# Patient Record
Sex: Female | Born: 1945 | Race: White | Hispanic: No | Marital: Married | Smoking: Never smoker
Health system: Southern US, Community
[De-identification: ages and names within clinical notes are randomized; demographics above are authoritative.]

## PROBLEM LIST (undated history)

## (undated) DIAGNOSIS — R51 Headache: Secondary | ICD-10-CM

## (undated) DIAGNOSIS — U071 COVID-19: Secondary | ICD-10-CM

## (undated) DIAGNOSIS — R519 Headache, unspecified: Secondary | ICD-10-CM

## (undated) DIAGNOSIS — K469 Unspecified abdominal hernia without obstruction or gangrene: Secondary | ICD-10-CM

## (undated) DIAGNOSIS — K219 Gastro-esophageal reflux disease without esophagitis: Secondary | ICD-10-CM

## (undated) DIAGNOSIS — I10 Essential (primary) hypertension: Secondary | ICD-10-CM

## (undated) DIAGNOSIS — G8929 Other chronic pain: Secondary | ICD-10-CM

## (undated) HISTORY — DX: Essential (primary) hypertension: I10

## (undated) HISTORY — DX: Headache: R51

## (undated) HISTORY — DX: Other chronic pain: G89.29

## (undated) HISTORY — DX: Unspecified abdominal hernia without obstruction or gangrene: K46.9

## (undated) HISTORY — PX: OTHER SURGICAL HISTORY: SHX169

## (undated) HISTORY — PX: TUBAL LIGATION: SHX77

## (undated) HISTORY — PX: CHOLECYSTECTOMY: SHX55

## (undated) HISTORY — PX: TONSILLECTOMY: SUR1361

## (undated) HISTORY — PX: APPENDECTOMY: SHX54

## (undated) HISTORY — PX: COLONOSCOPY: SHX174

## (undated) HISTORY — DX: Headache, unspecified: R51.9

---

## 1998-08-01 ENCOUNTER — Ambulatory Visit: Admission: RE | Admit: 1998-08-01 | Discharge: 1998-08-01 | Payer: Self-pay

## 1998-10-28 ENCOUNTER — Encounter: Payer: Self-pay | Admitting: Pulmonary Disease

## 1998-10-28 ENCOUNTER — Ambulatory Visit (HOSPITAL_COMMUNITY): Admission: RE | Admit: 1998-10-28 | Discharge: 1998-10-28 | Payer: Self-pay | Admitting: Pulmonary Disease

## 2006-11-16 ENCOUNTER — Encounter: Admission: RE | Admit: 2006-11-16 | Discharge: 2006-11-16 | Payer: Self-pay | Admitting: Orthopedic Surgery

## 2006-12-08 ENCOUNTER — Encounter: Admission: RE | Admit: 2006-12-08 | Discharge: 2007-01-12 | Payer: Self-pay | Admitting: Orthopedic Surgery

## 2007-08-12 ENCOUNTER — Other Ambulatory Visit: Admission: RE | Admit: 2007-08-12 | Discharge: 2007-08-12 | Payer: Self-pay | Admitting: Family Medicine

## 2007-08-12 ENCOUNTER — Ambulatory Visit: Payer: Self-pay | Admitting: Family Medicine

## 2007-08-12 DIAGNOSIS — R079 Chest pain, unspecified: Secondary | ICD-10-CM | POA: Insufficient documentation

## 2007-08-15 ENCOUNTER — Encounter: Payer: Self-pay | Admitting: Family Medicine

## 2007-08-15 DIAGNOSIS — E785 Hyperlipidemia, unspecified: Secondary | ICD-10-CM | POA: Insufficient documentation

## 2007-08-17 LAB — CONVERTED CEMR LAB: Pap Smear: NORMAL

## 2007-08-19 ENCOUNTER — Telehealth (INDEPENDENT_AMBULATORY_CARE_PROVIDER_SITE_OTHER): Payer: Self-pay | Admitting: *Deleted

## 2010-02-16 LAB — CONVERTED CEMR LAB
ALT: 23 units/L (ref 0–35)
AST: 22 units/L (ref 0–37)
Albumin: 3.8 g/dL (ref 3.5–5.2)
Alkaline Phosphatase: 102 units/L (ref 39–117)
BUN: 14 mg/dL (ref 6–23)
CO2: 23 meq/L (ref 19–32)
Calcium: 9 mg/dL (ref 8.4–10.5)
Chloride: 108 meq/L (ref 96–112)
Cholesterol, target level: 200 mg/dL
Cholesterol: 212 mg/dL — ABNORMAL HIGH (ref 0–200)
Creatinine, Ser: 0.78 mg/dL (ref 0.40–1.20)
Glucose, Bld: 97 mg/dL (ref 70–99)
HDL goal, serum: 40 mg/dL
HDL: 53 mg/dL (ref >39)
LDL Cholesterol: 134 mg/dL — ABNORMAL HIGH (ref 0–99)
LDL Goal: 160 mg/dL
Potassium: 4 meq/L (ref 3.5–5.3)
Sodium: 141 meq/L (ref 135–145)
TSH: 2.084 microintl units/mL (ref 0.350–4.50)
Total Bilirubin: 0.2 mg/dL — ABNORMAL LOW (ref 0.3–1.2)
Total CHOL/HDL Ratio: 4
Total Protein: 6.6 g/dL (ref 6.0–8.3)
Triglycerides: 124 mg/dL (ref <150)
VLDL: 25 mg/dL (ref 0–40)

## 2011-04-24 ENCOUNTER — Encounter: Payer: Self-pay | Admitting: Pulmonary Disease

## 2011-04-27 ENCOUNTER — Ambulatory Visit (INDEPENDENT_AMBULATORY_CARE_PROVIDER_SITE_OTHER): Payer: Medicare Other | Admitting: Critical Care Medicine

## 2011-04-27 ENCOUNTER — Telehealth: Payer: Self-pay | Admitting: Critical Care Medicine

## 2011-04-27 ENCOUNTER — Encounter: Payer: Self-pay | Admitting: Critical Care Medicine

## 2011-04-27 VITALS — BP 156/80 | HR 78 | Temp 98.0°F | Ht 68.0 in | Wt 197.0 lb

## 2011-04-27 DIAGNOSIS — R05 Cough: Secondary | ICD-10-CM

## 2011-04-27 DIAGNOSIS — I10 Essential (primary) hypertension: Secondary | ICD-10-CM

## 2011-04-27 DIAGNOSIS — J679 Hypersensitivity pneumonitis due to unspecified organic dust: Secondary | ICD-10-CM | POA: Insufficient documentation

## 2011-04-27 DIAGNOSIS — R918 Other nonspecific abnormal finding of lung field: Secondary | ICD-10-CM

## 2011-04-27 DIAGNOSIS — R059 Cough, unspecified: Secondary | ICD-10-CM

## 2011-04-27 LAB — IGE: IgE (Immunoglobulin E), Serum: 12.8 IU/mL (ref 0.0–180.0)

## 2011-04-27 MED ORDER — PREDNISONE 10 MG PO TABS: ORAL_TABLET | ORAL | Status: DC

## 2011-04-27 MED ORDER — FLUTICASONE PROPIONATE 50 MCG/ACT NA SUSP: 2.0000 | Freq: Every day | NASAL | Status: DC

## 2011-04-27 MED ORDER — RABEPRAZOLE SODIUM 20 MG PO TBEC: 20.0000 mg | DELAYED_RELEASE_TABLET | Freq: Every day | ORAL | Status: DC

## 2011-04-27 MED ORDER — BUDESONIDE-FORMOTEROL FUMARATE 160-4.5 MCG/ACT IN AERO: 2.0000 | INHALATION_SPRAY | Freq: Two times a day (BID) | RESPIRATORY_TRACT | Status: DC

## 2011-04-27 NOTE — Patient Instructions (Signed)
Prednisone 10mg  Take 4 for four days 3 for four days 2 for four days 1 for four days Symbicort two puff twice daily Flonase two puff each nostril daily Use aciphex daily Labs today I will review CT and call you Return 6 weeks for recheck Follow reflux diet Keep sugar free lozenge in mouth

## 2011-04-27 NOTE — Progress Notes (Signed)
Subjective:    Patient ID: Claudia Joseph, female    DOB: 08-Oct-1945, 66 y.o.   MRN: [id number]  HPI Comments: Pt been to ENT and had CT Scan sinus and was clear  [place] Swallow eval was normal Has EGD and colonoscopy planned per [facility]  Dr Jason Fila Pt dyspneic with exertion  Cough This is a recurrent problem. The current episode started more than 1 month ago. The problem has been unchanged. Episode frequency: worse at night.  also if gets hot in the car will cough. The cough is productive of sputum (mouth is dry, mucus now is white to pale yellow). Associated symptoms include headaches, postnasal drip, a sore throat, shortness of breath and wheezing. Pertinent negatives include no chest pain, chills, ear congestion, ear pain, fever, heartburn, myalgias, rash or rhinorrhea. Associated symptoms comments: Wants to clear throat and is hoarse. The symptoms are aggravated by lying down and fumes (strangled on food, if eats chicken salad will burp this up). Risk factors for lung disease include occupational exposure (Works at a [employer], Several employees ill, black mold under the building.  HVAC replaced and cleaned. Also has a basement finsished and is in the basement). Treatments tried: No recent pred pulse. Her past medical history is significant for environmental allergies. There is no history of asthma, bronchiectasis, bronchitis, COPD, emphysema or pneumonia. allergy tests positive for mold.   66 y.o. WF referred for abn CT Chest Pt with bronchitis yearly.  At least twice a year. Past year ill 11/12.  Rx ABX per PCP.  Not much improvement,  Pt rx pred x 30.   Cough was primary issue.  Loss of voice for two weeks.   Then ok in Jan/Feb.  This ill again,  Few weeks ago. More cough productive returned. Rx avelox for 10day (has two left)  Cough seems the same.    Past Medical History  Diagnosis Date  . Head pain, chronic     history of  . Hypertension   . Hernia      Family History    Problem Relation Age of Onset  . Leukemia Father     deceased  . Colon cancer Maternal Grandmother     deceased at age 18  . Tongue cancer Brother      History   Social History  . Marital Status: Married    Spouse Name: N/A    Number of Children: 3  . Years of Education: N/A   Occupational History  . Dental Office    Social History Main Topics  . Smoking status: Never Smoker   . Smokeless tobacco: Never Used  . Alcohol Use: No  . Drug Use: No  . Sexually Active: Not on file   Other Topics Concern  . Not on file   Social History Narrative  . No narrative on file     Allergies  Allergen Reactions  . Meperidine Hcl     Demerol causes severe HAs     No outpatient prescriptions prior to visit.      Review of Systems  Constitutional: Positive for fatigue. Negative for fever, chills, diaphoresis, activity change, appetite change and unexpected weight change.  HENT: Positive for congestion, sore throat, trouble swallowing, voice change, postnasal drip and sinus pressure. Negative for hearing loss, ear pain, nosebleeds, facial swelling, rhinorrhea, sneezing, mouth sores, neck pain, neck stiffness, dental problem, tinnitus and ear discharge.   Eyes: Negative for photophobia, discharge, itching and visual disturbance.  Respiratory: Positive for  cough, choking, shortness of breath and wheezing. Negative for apnea, chest tightness and stridor.   Cardiovascular: Negative for chest pain, palpitations and leg swelling.  Gastrointestinal: Positive for nausea and constipation. Negative for heartburn, vomiting, abdominal pain, blood in stool and abdominal distention.  Genitourinary: Negative for dysuria, urgency, frequency, hematuria, flank pain, decreased urine volume and difficulty urinating.  Musculoskeletal: Positive for back pain. Negative for myalgias, joint swelling, arthralgias and gait problem.  Skin: Negative for color change, pallor and rash.  Neurological: Positive  for headaches. Negative for dizziness, tremors, seizures, syncope, speech difficulty, weakness, light-headedness and numbness.  Hematological: Positive for environmental allergies. Negative for adenopathy. Does not bruise/bleed easily.  Psychiatric/Behavioral: Positive for sleep disturbance. Negative for confusion and agitation. The patient is not nervous/anxious.        Objective:   Physical Exam Filed Vitals:   04/27/11 1011  BP: 156/80  Pulse: 78  Temp: 98 F (36.7 C)  TempSrc: Oral  Height: 5\' 8"  (1.727 m)  Weight: 197 lb (89.359 kg)  SpO2: 96%    Gen: Pleasant, well-nourished, in no distress,  normal affect  ENT: No lesions,  mouth clear,  oropharynx clear, +++ postnasal drip, mild nasal inflammation  Neck: No JVD, no TMG, no carotid bruits  Lungs: No use of accessory muscles, no dullness to percussion,exp wheezes  Cardiovascular: RRR, heart sounds normal, no murmur or gallops, no peripheral edema  Abdomen: soft and NT, no HSM,  BS normal  Musculoskeletal: No deformities, no cyanosis or clubbing  Neuro: alert, non focal  Skin: Warm, no lesions or rashes   04/22/11  CT Chest :  Non specific LUL opacities, LLL nodule  4/ 8/13 Spiro: normal spirometry        Assessment & Plan:   Cough 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 amLODipine (NORVASC) 5 MG tablet Take 1 tablet by mouth Daily.      . AVELOX 400 MG tablet Take 1 tablet by mouth  daily.      . CASCARA SAGRADA PO Take by mouth as needed.      . Glucosamine-Chondroitin (MOVE FREE PO) Take 2 tablets by mouth daily.      . hydrochlorothiazide (HYDRODIURIL) 25 MG tablet Take 1 tablet by mouth daily.      . mupirocin ointment (BACTROBAN) 2 % as needed.      . RABEprazole (ACIPHEX) 20 MG tablet Take 1 tablet (20 mg total) by mouth daily.      Marland Kitchen triamcinolone (KENALOG) 0.1 % paste as needed.      Marland Kitchen DISCONTD: RABEprazole (ACIPHEX) 20 MG tablet Take 20 mg by mouth daily as needed.      . budesonide-formoterol (SYMBICORT) 160-4.5 MCG/ACT inhaler Inhale 2 puffs into the lungs 2 (two) times daily.  1 Inhaler  12  . fluticasone (FLONASE) 50 MCG/ACT nasal spray Place 2 sprays into the nose daily.  16 g  6  . predniSONE (DELTASONE) 10 MG tablet Take 4 for four  days 3 for four days 2 for four days 1 for four days  40 tablet  0

## 2011-04-27 NOTE — Telephone Encounter (Signed)
Pt wants to know would Dr. Delford Field rec that her family be tested for mold exposure as well? Please advise. Carron Curie, CMA

## 2011-04-27 NOTE — Telephone Encounter (Signed)
Not as of yet

## 2011-04-27 NOTE — Telephone Encounter (Signed)
Pt aware of PWs recs.

## 2011-04-27 NOTE — Assessment & Plan Note (Signed)
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

## 2011-04-28 ENCOUNTER — Telehealth: Payer: Self-pay | Admitting: Critical Care Medicine

## 2011-04-28 NOTE — Telephone Encounter (Signed)
Dr. Jason Fila office is asking for clearance for the pt to have colonoscopy. I advised Dr. Delford Field is out of the office and they said this would be ok to wait till his return. Please advise. Carron Curie, CMA

## 2011-04-30 ENCOUNTER — Institutional Professional Consult (permissible substitution): Payer: Self-pay | Admitting: Critical Care Medicine

## 2011-04-30 ENCOUNTER — Encounter: Payer: Self-pay | Admitting: *Deleted

## 2011-04-30 LAB — FUNGAL ANTIBODIES PANEL, ID-BLOOD
Aspergillus fumigatus: NEGATIVE
Coccidioides Antibody ID: NEGATIVE
Histoplasma Antibody, ID: NEGATIVE

## 2011-04-30 NOTE — Telephone Encounter (Signed)
Spoke with Diane and notified ok per PW for colonoscopy. Letter faxed to her attn per her request at [phone].

## 2011-04-30 NOTE — Telephone Encounter (Signed)
The pt may have a colonoscopy

## 2011-04-30 NOTE — Telephone Encounter (Signed)
lmomtcb x1 for diane 

## 2011-04-30 NOTE — Telephone Encounter (Signed)
LMTCB for [name] Is VO okay or does she need letter or form filled out? Will await call back.

## 2011-05-04 LAB — HYPERSENSITIVITY PNUEMONITIS PROFILE

## 2011-05-07 ENCOUNTER — Telehealth: Payer: Self-pay | Admitting: *Deleted

## 2011-05-07 NOTE — Telephone Encounter (Signed)
No She needs a repeat OV to regroup

## 2011-05-07 NOTE — Telephone Encounter (Signed)
PT is having Endoscopy 05-08-11.  She wants to know if Dr Delford Field thinks she should have Chest CT repeated since  The first one was not a good picture.  Please advise.

## 2011-05-07 NOTE — Telephone Encounter (Signed)
Pt is aware and will plan to keep follow-up on 06/04/11 in [place] with PW.

## 2011-06-04 ENCOUNTER — Ambulatory Visit (INDEPENDENT_AMBULATORY_CARE_PROVIDER_SITE_OTHER): Payer: Medicare Other | Admitting: Critical Care Medicine

## 2011-06-04 ENCOUNTER — Encounter: Payer: Self-pay | Admitting: Critical Care Medicine

## 2011-06-04 ENCOUNTER — Ambulatory Visit (HOSPITAL_BASED_OUTPATIENT_CLINIC_OR_DEPARTMENT_OTHER)
Admission: RE | Admit: 2011-06-04 | Discharge: 2011-06-04 | Disposition: A | Discharge status: Home / Self Care | Payer: Medicare Other | Source: Ambulatory Visit | Attending: Critical Care Medicine | Admitting: Critical Care Medicine

## 2011-06-04 VITALS — BP 136/82 | HR 87 | Temp 98.5°F | Ht 68.0 in | Wt 197.0 lb

## 2011-06-04 DIAGNOSIS — R05 Cough: Secondary | ICD-10-CM | POA: Insufficient documentation

## 2011-06-04 DIAGNOSIS — R062 Wheezing: Secondary | ICD-10-CM | POA: Insufficient documentation

## 2011-06-04 DIAGNOSIS — R0602 Shortness of breath: Secondary | ICD-10-CM | POA: Insufficient documentation

## 2011-06-04 DIAGNOSIS — J8410 Pulmonary fibrosis, unspecified: Secondary | ICD-10-CM

## 2011-06-04 DIAGNOSIS — R059 Cough, unspecified: Secondary | ICD-10-CM

## 2011-06-04 NOTE — Assessment & Plan Note (Addendum)
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 Extensive exposure in the home, proven Plan Stay on flonase and symbcort Stay on PPI Avoid mold exposure

## 2011-06-04 NOTE — Patient Instructions (Addendum)
Stay on flonase and symbicort Avoid mold exposure Stay on aciphex Reflux diet Return 4 months

## 2011-06-04 NOTE — Progress Notes (Signed)
Quick Note:  Called, spoke with pt. I informed her of CXR results and recs per Dr. Wright. She verbalized understanding of this and voiced no further questions/concerns at this time. ______ 

## 2011-06-04 NOTE — Progress Notes (Signed)
Quick Note:  Notify the patient that the Xray is stable and no pneumonia No change in medications are recommended. Continue current meds as prescribed at last office visit ______ 

## 2011-06-04 NOTE — Progress Notes (Signed)
Subjective:    Patient ID: Claudia Joseph, female    DOB: 1945/11/25, 66 y.o.   MRN: [id number]  HPI 66 y.o. WF referred for abn CT Chest Pt with bronchitis yearly.  At least twice a year. Past year ill 11/12.  Rx ABX per PCP.  Not much improvement,  Pt rx pred x 30.   Cough was primary issue.  Loss of voice for two weeks.   Then ok in Jan/Feb.  This ill again,  Few weeks ago. More cough productive returned. Rx avelox for 10day (has two left)  Cough seems the same.    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 No chest pain.  Dyspnea is better.  No fever.  Off ABX Took the pred and is on symbicort.  Pt had EGD : hiatal hernia and stretched esophagus.     Past Medical History  Diagnosis Date  . Head pain, chronic     history of  . Hypertension   . Hernia      Family History  Problem Relation Age of Onset  . Leukemia Father     deceased  . Colon cancer Maternal Grandmother     deceased at age 47  . Tongue cancer Brother      History   Social History  . Marital Status: Married    Spouse Name: N/A    Number of Children: 3  . Years of Education: N/A   Occupational History  . Dental Office    Social History Main Topics  . Smoking status: Never Smoker   . Smokeless tobacco: Never Used  . Alcohol Use: No  . Drug Use: No  . Sexually Active: Not on file   Other Topics Concern  . Not on file   Social History Narrative  . No narrative on file     Allergies  Allergen Reactions  . Meperidine Hcl    Demerol causes severe HAs     Outpatient Prescriptions Prior to Visit  Medication Sig Dispense Refill  . amLODipine (NORVASC) 5 MG tablet Take 1 tablet by mouth Daily.      . budesonide-formoterol (SYMBICORT) 160-4.5 MCG/ACT inhaler Inhale 2 puffs into the lungs 2 (two) times daily.  1 Inhaler  12  . CASCARA SAGRADA PO Take by mouth as needed.      . fluticasone (FLONASE) 50 MCG/ACT nasal spray Place 2 sprays into the nose daily.  16 g  6  . Glucosamine-Chondroitin (MOVE FREE PO) Take 2 tablets by mouth daily.      . hydrochlorothiazide (HYDRODIURIL) 25 MG tablet Take 1 tablet by mouth daily.      . RABEprazole (ACIPHEX) 20 MG tablet Take 1 tablet (20 mg total) by mouth daily.      Marland Kitchen triamcinolone (KENALOG) 0.1 % paste as needed.      . AVELOX 400 MG tablet Take 1 tablet by mouth daily.      . mupirocin ointment (BACTROBAN) 2 % as needed.      . predniSONE (DELTASONE) 10  MG tablet Take 4 for four days 3 for four days 2 for four days 1 for four days  40 tablet  0      Review of Systems  Constitutional: Positive for fatigue. Negative for diaphoresis, activity change, appetite change and unexpected weight change.  HENT: Positive for congestion, trouble swallowing, voice change and sinus pressure. Negative for hearing loss, nosebleeds, facial swelling, sneezing, mouth sores, neck pain, neck stiffness, dental problem, tinnitus and ear discharge.   Eyes: Negative for photophobia, discharge, itching and visual disturbance.  Respiratory: Positive for choking. Negative for apnea, chest tightness and stridor.   Cardiovascular: Negative for palpitations and leg swelling.  Gastrointestinal: Positive for nausea and constipation. Negative for vomiting, abdominal pain, blood in stool and abdominal distention.  Genitourinary: Negative for dysuria, urgency, frequency, hematuria, flank pain, decreased urine volume and difficulty urinating.  Musculoskeletal: Positive for back pain. Negative for joint  swelling, arthralgias and gait problem.  Skin: Negative for color change and pallor.  Neurological: Negative for dizziness, tremors, seizures, syncope, speech difficulty, weakness, light-headedness and numbness.  Hematological: Negative for adenopathy. Does not bruise/bleed easily.  Psychiatric/Behavioral: Positive for sleep disturbance. Negative for confusion and agitation. The patient is not nervous/anxious.        Objective:   Physical Exam  Filed Vitals:   06/04/11 1359  BP: 136/82  Pulse: 87  Temp: 98.5 F (36.9 C)  TempSrc: Oral  Height: 5\' 8"  (1.727 m)  Weight: 197 lb (89.359 kg)  SpO2: 97%    Gen: Pleasant, well-nourished, in no distress,  normal affect  ENT: No lesions,  mouth clear,  oropharynx clear, + postnasal drip, mild nasal inflammation  Neck: No JVD, no TMG, no carotid bruits  Lungs: No use of accessory muscles, no dullness to percussion,no wheezes  Cardiovascular: RRR, heart sounds normal, no murmur or gallops, no peripheral edema  Abdomen: soft and NT, no HSM,  BS normal  Musculoskeletal: No deformities, no cyanosis or clubbing  Neuro: alert, non focal  Skin: Warm, no lesions or rashes   04/22/11  CT Chest :  Non specific LUL opacities, LLL nodule  4/ 8/13 Spiro: normal spirometry        Assessment & Plan:   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 Extensive exposure in the home, proven Plan Stay on flonase and symbcort Stay on PPI Avoid mold exposure     Updated Medication List Outpatient Encounter Prescriptions as of 06/04/2011  Medication Sig Dispense Refill  . amLODipine (NORVASC) 5 MG tablet Take 1 tablet by mouth Daily.      . budesonide-formoterol (SYMBICORT) 160-4.5 MCG/ACT inhaler Inhale 2 puffs into  the lungs 2 (two) times daily.  1 Inhaler  12  . CASCARA SAGRADA PO Take by mouth as needed.      . fluticasone (FLONASE) 50 MCG/ACT nasal spray Place 2 sprays into the nose daily.  16 g  6  . Glucosamine-Chondroitin (MOVE FREE PO) Take 2 tablets by mouth daily.      . hydrochlorothiazide (HYDRODIURIL) 25 MG tablet Take 1 tablet by mouth daily.      . RABEprazole (ACIPHEX) 20 MG tablet Take 1 tablet (20 mg total) by mouth daily.      Marland Kitchen triamcinolone (KENALOG) 0.1 % paste as needed.      Marland Kitchen DISCONTD: AVELOX 400 MG  tablet Take 1 tablet by mouth daily.      Marland Kitchen DISCONTD: mupirocin ointment (BACTROBAN) 2 % as needed.      Marland Kitchen DISCONTD: predniSONE (DELTASONE) 10 MG tablet Take 4 for four days 3 for four days 2 for four days 1 for four days  40 tablet  0

## 2011-09-14 ENCOUNTER — Encounter: Payer: Self-pay | Admitting: Adult Health

## 2011-09-14 ENCOUNTER — Ambulatory Visit (INDEPENDENT_AMBULATORY_CARE_PROVIDER_SITE_OTHER): Payer: Medicare Other | Admitting: Adult Health

## 2011-09-14 VITALS — BP 140/90 | HR 88 | Temp 97.6°F | Ht 68.0 in | Wt 199.8 lb

## 2011-09-14 DIAGNOSIS — J40 Bronchitis, not specified as acute or chronic: Secondary | ICD-10-CM

## 2011-09-14 MED ORDER — AZITHROMYCIN 250 MG PO TABS: ORAL_TABLET | ORAL | Status: AC

## 2011-09-14 MED ORDER — PREDNISONE 10 MG PO TABS: ORAL_TABLET | ORAL | Status: DC

## 2011-09-14 NOTE — Progress Notes (Signed)
Subjective:    Patient ID: Claudia Joseph, female    DOB: [birth year], 66 y.o.   MRN: [id number]  HPI 66 y.o. WF never smoker referred for abn CT Chest Pt with bronchitis yearly.  At least twice a year. Past year ill 11/12.  Rx ABX per PCP.  Not much improvement,  Pt rx pred x 30.   Cough was primary issue.  Loss of voice for two weeks.   Then ok in Jan/Feb.  This ill again,  Few weeks ago. More cough productive returned. Rx avelox for 10day (has two left)  Cough seems the same.    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 No chest pain.  Dyspnea is better.  No fever.  Off ABX Took the pred and is on symbicort.  Pt had EGD : hiatal hernia and stretched esophagus.    09/14/2011 Acute OV  Complains of c/o cough with white mucus, wheezing, sob, and chest tightness for 3 days.  Was doing okay until last few days.  Coughing up copious white/yellow.  Hoarseness and sinus drainage.  Stopped symbicort because she felt so much better . Stopped around 6-8 weeks .  No fever , hemoptysis , edema.  Off zyrtec.   Review of Systems  Constitutional:   No  weight loss, night sweats,  Fevers, chills,  +fatigue, or  lassitude.  HEENT:   No headaches,  Difficulty swallowing,  Tooth/dental problems, or  Sore throat,                No sneezing, itching, ear ache,  +nasal congestion, post nasal drip,   CV:  No chest pain,  Orthopnea, PND, swelling in lower extremities, anasarca, dizziness, palpitations, syncope.   GI  No heartburn,  indigestion, abdominal pain, nausea, vomiting, diarrhea, change in bowel habits, loss of appetite, bloody stools.   Resp:    No coughing up of blood.     No chest wall deformity  Skin: no rash or lesions.  GU: no dysuria, change in color of urine, no urgency or frequency.  No flank pain, no hematuria   MS:  No joint pain or swelling.  No decreased range of motion.  No back pain.  Psych:  No change in mood or affect. No depression or anxiety.  No memory loss.          Objective:   Physical Exam   Gen: Pleasant, well-nourished, in no distress,  normal affect  ENT: No lesions,  mouth clear,  oropharynx clear, + postnasal drip, mild nasal inflammation  Neck: No JVD, no TMG, no carotid bruits  Lungs: No use of accessory muscles, no dullness to percussion,no wheezes  Cardiovascular: RRR, heart sounds normal, no murmur or gallops, no peripheral edema  Abdomen: soft and NT, no HSM,  BS normal  Musculoskeletal: No deformities, no cyanosis or clubbing  Neuro: alert, non focal  Skin: Warm, no lesions or rashes   04/22/11  CT Chest :  Non specific LUL opacities, LLL nodule  4/ 8/13 Cleda Daub:  normal spirometry        Assessment & Plan:

## 2011-09-14 NOTE — Assessment & Plan Note (Signed)
Mild flare with associated AR  Underlying hx of hypersensitivity pnemonitis w/ prev mold exposure  No known exposure of recent exposure   Plan;  rednisone  Taper over next week.  Restart Symbicort two puff twice daily  Restart Flonase two puff each nostril daily Delsym 2 tsp Twice daily  As needed  Cough  May use Zyrtec 10mg  At bedtime  As needed  Drainage  Saline nasal spray As needed   Please contact office for sooner follow up if symptoms do not improve or worsen or seek emergency care  follow up Dr. Delford Field  Next month and As needed   Zpack -to have on hold if symptoms worsen with discolored mucus .

## 2011-09-14 NOTE — Patient Instructions (Addendum)
Prednisone  Taper over next week.  Restart Symbicort two puff twice daily  Restart Flonase two puff each nostril daily Delsym 2 tsp Twice daily  As needed  Cough  May use Zyrtec 10mg  At bedtime  As needed  Drainage  Saline nasal spray As needed   Please contact office for sooner follow up if symptoms do not improve or worsen or seek emergency care  follow up Dr. Delford Field  Next month and As needed   Zpack -to have on hold if symptoms worsen with discolored mucus .

## 2011-10-08 ENCOUNTER — Ambulatory Visit (INDEPENDENT_AMBULATORY_CARE_PROVIDER_SITE_OTHER): Payer: Medicare Other | Admitting: Critical Care Medicine

## 2011-10-08 ENCOUNTER — Ambulatory Visit (HOSPITAL_BASED_OUTPATIENT_CLINIC_OR_DEPARTMENT_OTHER)
Admission: RE | Admit: 2011-10-08 | Discharge: 2011-10-08 | Disposition: A | Discharge status: Home / Self Care | Payer: Medicare Other | Source: Ambulatory Visit | Attending: Critical Care Medicine | Admitting: Critical Care Medicine

## 2011-10-08 ENCOUNTER — Encounter: Payer: Self-pay | Admitting: Critical Care Medicine

## 2011-10-08 VITALS — BP 152/82 | HR 74 | Temp 97.7°F | Ht 68.0 in | Wt 203.0 lb

## 2011-10-08 DIAGNOSIS — J679 Hypersensitivity pneumonitis due to unspecified organic dust: Secondary | ICD-10-CM

## 2011-10-08 DIAGNOSIS — J984 Other disorders of lung: Secondary | ICD-10-CM | POA: Insufficient documentation

## 2011-10-08 NOTE — Progress Notes (Signed)
Subjective:    Patient ID: Claudia Joseph, female    DOB: 1945/09/05, 66 y.o.   MRN: [id number]  HPI HPI 66 y.o. WF never smoker referred for abn CT Chest Pt with bronchitis yearly.  At least twice a year. Past year ill 11/12.  Rx ABX per PCP.  Not much improvement,  Pt rx pred x 30.   Cough was primary issue.  Loss of voice for two weeks.   Then ok in Jan/Feb.  This ill again,  Few weeks ago. More cough productive returned. Rx avelox for 10day (has two left)  Cough seems the same.    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 No chest pain.  Dyspnea is better.  No fever.  Off ABX Took the pred and is on symbicort.  Pt had EGD : hiatal hernia and stretched esophagus.    09/14/2011 Acute OV  Complains of c/o cough with white mucus, wheezing, sob, and chest tightness for 3 days.  Was doing okay until last few days.  Coughing up copious white/yellow.  Hoarseness and sinus drainage.  Stopped symbicort because she felt so much better . Stopped around 6-8 weeks .  No fever , hemoptysis , edema.  Off zyrtec.   10/08/2011 Seen by NP 8/26 and Rx rednisone Taper over next week.  Restart Symbicort two puff twice daily  Restart Flonase two puff each nostril daily  Delsym 2 tsp Twice daily As needed Cough  May use Zyrtec 10mg  At bedtime As needed Drainage  Saline nasal spray As needed  Since saw NP is better.  Saw PCP and had low potassium.  WBC elevated. LFTs elevated. U/S liver done:  Fatty liver.  Rx keflex one week ago per  pcp, leg bite and infected.  Site on leg is better.   Now  : min cough,  Clears throat a lot, mucus is white foamy.  Notes some wheezing. Pt is dyspneic if in a hurry.      Review of Systems 11 pt Ros neg.except as above    Objective:   Physical Exam Filed Vitals:   10/08/11 1417  BP: 152/82  Pulse: 74  Temp: 97.7 F (36.5 C)  TempSrc: Oral  Height: 5\' 8"  (1.727 m)  Weight: 203 lb (92.08 kg)  SpO2: 98%    Gen: Pleasant, well-nourished, in no distress,  normal affect  ENT: No lesions,  mouth clear,  oropharynx clear, no postnasal drip  Neck: No JVD, no TMG, no carotid bruits  Lungs: No use of accessory muscles, no dullness to percussion, clear without rales or rhonchi  Cardiovascular: RRR, heart sounds normal, no murmur or gallops, no peripheral edema  Abdomen: soft and NT, no HSM,  BS normal  Musculoskeletal: No deformities, no cyanosis or clubbing  Neuro: alert, non focal  Skin: Warm, no lesions or rashes  No results found.        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 amLODipine (NORVASC) 5 MG tablet Take 1 tablet by mouth Daily.      . ASTEPRO 0.15 % SOLN Place 2 sprays into the nose as needed.      . budesonide-formoterol (SYMBICORT) 160-4.5 MCG/ACT inhaler Inhale 2 puffs into the lungs as needed.      . CASCARA SAGRADA PO Take by mouth as needed.      . cephALEXin (KEFLEX) 500 MG capsule Take 500 mg by mouth 2 (two)  times daily.      Marland Kitchen dexlansoprazole (DEXILANT) 60 MG capsule Take 60 mg by mouth daily.      . fluticasone (FLONASE) 50 MCG/ACT nasal spray Place 1 spray into the nose daily as needed.       . Glucosamine-Chondroitin (MOVE FREE PO) Take 2 tablets by mouth daily.      . potassium chloride (MICRO-K) 10 MEQ CR capsule Take 1 tablet by mouth Daily.      Marland Kitchen triamcinolone (KENALOG) 0.1 % paste as needed.      . hydrochlorothiazide (HYDRODIURIL) 25 MG tablet Take 1 tablet by mouth daily.      Marland Kitchen DISCONTD: predniSONE (DELTASONE) 10 MG tablet 4 tabs for 2 days, then 3 tabs for 2 days, 2 tabs for 2 days, then 1 tab for 2 days, then stop  20 tablet  0  . DISCONTD: RABEprazole (ACIPHEX) 20 MG tablet Take 1 tablet (20 mg total) by mouth daily.

## 2011-10-08 NOTE — Patient Instructions (Addendum)
Follow reflux diet strict Stay on symbicort If nose bleeds again, hold flonase Stay on astepro CT chest non contrast today if possible, I will call with results Return 4 months

## 2011-10-08 NOTE — Assessment & Plan Note (Signed)
History of hypersensitivity pneumonitis, upper and lower airway inflammation with associated reflux disease and environmental allergic factors History of pulmonary nodules as documented on CT the chest likely allergic pneumonitis in nature Plan Follow reflux diet strict Stay on symbicort If nose bleeds again, hold flonase Stay on astepro CT chest non contrast today to followup lung nodules Return 4 months

## 2012-02-11 ENCOUNTER — Encounter: Payer: Self-pay | Admitting: Critical Care Medicine

## 2012-02-11 ENCOUNTER — Ambulatory Visit (INDEPENDENT_AMBULATORY_CARE_PROVIDER_SITE_OTHER): Payer: Medicare Other | Admitting: Critical Care Medicine

## 2012-02-11 VITALS — BP 134/90 | HR 67 | Temp 98.2°F | Ht 68.0 in | Wt 200.0 lb

## 2012-02-11 DIAGNOSIS — R918 Other nonspecific abnormal finding of lung field: Secondary | ICD-10-CM

## 2012-02-11 DIAGNOSIS — J679 Hypersensitivity pneumonitis due to unspecified organic dust: Secondary | ICD-10-CM

## 2012-02-11 NOTE — Progress Notes (Signed)
Subjective:    Patient ID: Claudia Joseph, female    DOB: [birth year], 67 y.o.   MRN: [id number]  HPI  HPI 67 y.o. WF never smoker referred for abn CT Chest Pt with bronchitis yearly.  At least twice a year. Past year ill 11/12.  Rx ABX per PCP.  Not much improvement,  Pt rx pred x 30.   Cough was primary issue.  Loss of voice for two weeks.   Then ok in Jan/Feb.  This ill again,  Few weeks ago. More cough productive returned. Rx avelox for 10day (has two left)  Cough seems the same.    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 No chest pain.  Dyspnea is better.  No fever.  Off ABX Took the pred and is on symbicort.  Pt had EGD : hiatal hernia and stretched esophagus.    09/14/2011 Acute OV  Complains of c/o cough with white mucus, wheezing, sob, and chest tightness for 3 days.  Was doing okay until last few days.  Coughing up copious white/yellow.  Hoarseness and sinus drainage.  Stopped symbicort because she felt so much better . Stopped around 6-8 weeks .  No fever , hemoptysis , edema.  Off zyrtec.   10/08/2011 Seen by NP 8/26 and Rx rednisone Taper over next week.  Restart Symbicort two puff twice daily  Restart Flonase two puff each nostril daily  Delsym 2 tsp Twice daily As needed Cough  May use Zyrtec 10mg  At bedtime As needed Drainage  Saline nasal spray As needed  Since saw NP is better.  Saw PCP and had low potassium.  WBC elevated. LFTs elevated. U/S liver done:  Fatty liver.  Rx keflex one week ago per  pcp, leg bite and infected.  Site on leg is better.   Now  : min cough,  Clears throat a lot, mucus is white foamy.  Notes some wheezing. Pt is dyspneic if in a hurry.  02/11/2012 Pt had mold in the basement. $30000 to repair. 3/13 was repaired. In air vents.    Pt has weaned off inhalers and ok without.  Now no cough or dyspnea    Review of Systems  11 pt Ros neg.except as above    Objective:   Physical Exam  Filed Vitals:   02/11/12 1523  BP: 134/90  Pulse: 67  Temp: 98.2 F (36.8 C)  TempSrc: Oral  Height: 5\' 8"  (1.727 m)  Weight: 200 lb (90.719 kg)  SpO2: 100%    Gen: Pleasant, well-nourished, in no distress,  normal affect  ENT: No lesions,  mouth clear,  oropharynx clear, no postnasal drip  Neck: No JVD, no TMG, no carotid bruits  Lungs: No use of accessory muscles, no dullness to percussion, clear without rales or rhonchi  Cardiovascular: RRR, heart sounds normal, no murmur or gallops, no peripheral edema  Abdomen: soft and NT, no HSM,  BS normal  Musculoskeletal: No deformities, no cyanosis or clubbing  Neuro: alert, non focal  Skin: Warm, no  lesions or rashes  No results found.        Assessment & Plan:   No problem-specific assessment & plan notes found for this encounter.   Updated Medication List Outpatient Encounter Prescriptions as of 02/11/2012  Medication Sig Dispense Refill  . amLODipine (NORVASC) 5 MG tablet Take 1 tablet by mouth Daily.      . ASTEPRO 0.15 % SOLN Place 2 sprays into the nose as needed.      . budesonide-formoterol (SYMBICORT) 160-4.5 MCG/ACT inhaler Inhale 2 puffs into the lungs as needed.      . CASCARA SAGRADA PO Take by mouth as needed.      Marland Kitchen dexlansoprazole (DEXILANT) 60 MG capsule Take 60 mg by mouth daily.      . fluticasone (FLONASE) 50 MCG/ACT nasal spray Place 1 spray into the nose daily as needed.       . Glucosamine-Chondroitin (MOVE FREE PO) Take 2 tablets by mouth daily.      Marland Kitchen triamcinolone (KENALOG)  0.1 % paste as needed.      . [DISCONTINUED] cephALEXin (KEFLEX) 500 MG capsule Take 500 mg by mouth 2 (two) times daily.      . [DISCONTINUED] hydrochlorothiazide (HYDRODIURIL) 25 MG tablet Take 1 tablet by mouth daily.      . [DISCONTINUED] potassium chloride (MICRO-K) 10 MEQ CR capsule Take 1 tablet by mouth Daily.

## 2012-02-11 NOTE — Assessment & Plan Note (Addendum)
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

## 2012-02-11 NOTE — Patient Instructions (Addendum)
You may stop symbicort  You can stop astepro Use flonase as needed Return 8months with CT Chest ( we will order prior to visit)

## 2012-09-28 ENCOUNTER — Ambulatory Visit: Payer: Medicare Other | Admitting: Critical Care Medicine

## 2012-10-03 ENCOUNTER — Ambulatory Visit: Payer: Self-pay | Admitting: Critical Care Medicine

## 2012-10-03 ENCOUNTER — Ambulatory Visit (HOSPITAL_BASED_OUTPATIENT_CLINIC_OR_DEPARTMENT_OTHER)
Admission: RE | Admit: 2012-10-03 | Discharge: 2012-10-03 | Disposition: A | Discharge status: Home / Self Care | Payer: Medicare Other | Source: Ambulatory Visit | Attending: Critical Care Medicine | Admitting: Critical Care Medicine

## 2012-10-03 DIAGNOSIS — R918 Other nonspecific abnormal finding of lung field: Secondary | ICD-10-CM

## 2012-10-03 DIAGNOSIS — R059 Cough, unspecified: Secondary | ICD-10-CM | POA: Insufficient documentation

## 2012-10-03 DIAGNOSIS — R05 Cough: Secondary | ICD-10-CM | POA: Insufficient documentation

## 2012-10-04 ENCOUNTER — Telehealth: Payer: Self-pay | Admitting: *Deleted

## 2012-10-04 NOTE — Telephone Encounter (Signed)
Can cancel appt if feeling ok

## 2012-10-04 NOTE — Telephone Encounter (Signed)
Called pt to inform her of CT Chest results:  Result Note    Call pt and tell her CT chest stable, no change in nodules.   Would like one more CT in one year, if stable can the n stop any further CTs   -------  Spoke with pt.  Informed her of Ct Chest results and recs per Dr. Delford Field.  She verbalized understanding of this.  Pt has a pending OV with PW on this Thursday in [place].  She would like to know if PW still wants her to keep this OV.  Dr. Delford Field, pls advise.  Thank you.

## 2012-10-04 NOTE — Progress Notes (Signed)
Quick Note:  Spoke with pt. Informed her of CT Chest results and recs per Dr. Delford Field. She verbalized understanding. ______

## 2012-10-05 NOTE — Telephone Encounter (Signed)
Called, spoke with pt. Pt states she is feeling ok.  Reports her symptoms are pretty much the same as last time she was seen by PW.  Does report coughing more with the season change.  Cough is nonprod.  No increased SOB, wheezing, chest tightness, or chest pain.  Pt states she would like to cancel tomorrow's OV and will call back if her symptoms do not improve or worsen.  Will sign off and route msg to PW as FYI.

## 2012-10-06 ENCOUNTER — Ambulatory Visit: Payer: Self-pay | Admitting: Critical Care Medicine

## 2012-12-11 ENCOUNTER — Emergency Department (INDEPENDENT_AMBULATORY_CARE_PROVIDER_SITE_OTHER): Payer: Medicare Other

## 2012-12-11 ENCOUNTER — Emergency Department
Admission: EM | Admit: 2012-12-11 | Discharge: 2012-12-11 | Disposition: A | Discharge status: Home / Self Care | Payer: Medicare Other | Source: Home / Self Care | Attending: Family Medicine | Admitting: Family Medicine

## 2012-12-11 ENCOUNTER — Encounter: Payer: Self-pay | Admitting: Emergency Medicine

## 2012-12-11 DIAGNOSIS — R079 Chest pain, unspecified: Secondary | ICD-10-CM

## 2012-12-11 DIAGNOSIS — S20219A Contusion of unspecified front wall of thorax, initial encounter: Secondary | ICD-10-CM

## 2012-12-11 DIAGNOSIS — S20211A Contusion of right front wall of thorax, initial encounter: Secondary | ICD-10-CM

## 2012-12-11 HISTORY — DX: Gastro-esophageal reflux disease without esophagitis: K21.9

## 2012-12-11 NOTE — ED Notes (Signed)
Reports fall 8 days ago in bathroom and has had continued pain along lateral right rib cage since then; worse upon rolling over in bed and with cough.

## 2012-12-11 NOTE — ED Provider Notes (Signed)
CSN: [id number]     Arrival date & time 12/11/12  1128 History   First MD Initiated Contact with Patient 12/11/12 1147     Chief Complaint  Patient presents with  . Chest Pain      HPI Comments: Patient slipped in her bathtub one week ago, striking her right anterior chest on edge of tub.  She has had persistent pain with deep inspiration and movement .  No shortness of breath   Patient is a 67 y.o. female presenting with chest pain. The history is provided by the patient.  Chest Pain Chest pain location: right anterior chest. Pain quality: sharp   Pain radiates to:  Does not radiate Pain radiates to the back: no   Pain severity:  Moderate Onset quality:  Sudden Duration:  1 week Timing:  Intermittent Progression:  Unchanged Chronicity:  New Context: breathing, lifting and trauma   Relieved by:  Nothing Worsened by:  Coughing and movement Ineffective treatments: Advil. Associated symptoms: no abdominal pain, no back pain, no cough, no diaphoresis, no dizziness, no fatigue, no fever, no shortness of breath and no weakness     Past Medical History  Diagnosis Date  . Head pain, chronic     history of  . Hypertension   . Hernia   . GERD (gastroesophageal reflux disease)    Past Surgical History  Procedure Laterality Date  . Cholecystectomy    . Tubal ligation    . Appendectomy    . Tonsillectomy     Family History  Problem Relation Age of Onset  . Leukemia Father     deceased  . Colon cancer Maternal Grandmother     deceased at age 48  . Tongue cancer Brother    History  Substance Use Topics  . Smoking status: Never Smoker   . Smokeless tobacco: Never Used  . Alcohol Use: No   OB History   Grav Para Term Preterm Abortions TAB SAB Ect Mult Living                 Review of Systems  Constitutional: Negative for fever, diaphoresis and fatigue.  Respiratory: Negative for cough and shortness of breath.   Cardiovascular: Positive for chest pain.    Gastrointestinal: Negative for abdominal pain.  Musculoskeletal: Negative for back pain.  Neurological: Negative for dizziness and weakness.  All other systems reviewed and are negative.    Allergies  Meperidine hcl  Home Medications   Current Outpatient Rx  Name  Route  Sig  Dispense  Refill  . RABEprazole (ACIPHEX) 20 MG tablet   Oral   Take 20 mg by mouth daily.         Marland Kitchen amLODipine (NORVASC) 5 MG tablet   Oral   Take 1 tablet by mouth Daily.         . CASCARA SAGRADA PO   Oral   Take by mouth as needed.         Marland Kitchen dexlansoprazole (DEXILANT) 60 MG capsule   Oral   Take 60 mg by mouth daily.         Marland Kitchen EXPIRED: fluticasone (FLONASE) 50 MCG/ACT nasal spray   Nasal   Place 1 spray into the nose daily as needed.          . Glucosamine-Chondroitin (MOVE FREE PO)   Oral   Take 2 tablets by mouth daily.         Marland Kitchen triamcinolone (KENALOG) 0.1 % paste  as needed.          BP 128/76  Pulse 88  Temp(Src) 98.4 F (36.9 C) (Oral)  Resp 18  Ht 5\' 8"  (1.727 m)  Wt 198 lb (89.812 kg)  BMI 30.11 kg/m2  SpO2 98% Physical Exam  Nursing note and vitals reviewed. Constitutional: She is oriented to person, place, and time. She appears well-developed and well-nourished. No distress.  Patient is obese (BMI 30.1)  HENT:  Head: Atraumatic.  Mouth/Throat: Oropharynx is clear and moist.  Eyes: Conjunctivae are normal. Pupils are equal, round, and reactive to light.  Neck: Neck supple.  Cardiovascular: Normal heart sounds.   Pulmonary/Chest: Breath sounds normal. She has no wheezes. She has no rales. She exhibits tenderness. She exhibits no crepitus, no edema and no swelling.    Tenderness right anterior ribs as noted.  No ecchymosis.  Abdominal: Bowel sounds are normal.  Lymphadenopathy:    She has no cervical adenopathy.  Neurological: She is alert and oriented to person, place, and time.  Skin: Skin is warm and dry. No rash noted.    ED Course   Procedures  none    Imaging Review Dg Ribs Unilateral W/chest Right  12/11/2012   CLINICAL DATA:  Right rib pain status post fall  EXAM: RIGHT RIBS AND CHEST - 3+ VIEW  COMPARISON:  None.  FINDINGS: No fracture or other bone lesions are seen involving the ribs. There is no evidence of pneumothorax or pleural effusion. Both lungs are clear. Heart size and mediastinal contours are within normal limits. Skin staples and surgical clips projected within the right upper quadrant of the abdomen.  IMPRESSION: 1. No evidence of acute osseous abnormalities 2. Postsurgical changes right upper quadrant   Electronically Signed   By: Salome Holmes M.D.   On: 12/11/2012 12:50      MDM   1. Contusion of ribs, right, initial encounter    Rib belt applied May continue applying ice pack two or three times daily.  May also switch to a heating pad.  May continue Ibuprofen 200mg , 4 tabs every 8 hours with food.  Wear rib belt as needed. Followup with Family Doctor if not improved in about 3 weeks.    Lattie Haw, MD 12/11/12 1316

## 2013-04-06 IMAGING — CR DG CHEST 2V
2 series · 2 of 2 positions shown · non-contrast
Comparison: None.

CLINICAL DATA: Cough.  Wheezing.  Short of breath.  Exposed to
black mold. Hypotension.

CHEST - 2 VIEW

[w chest pa]
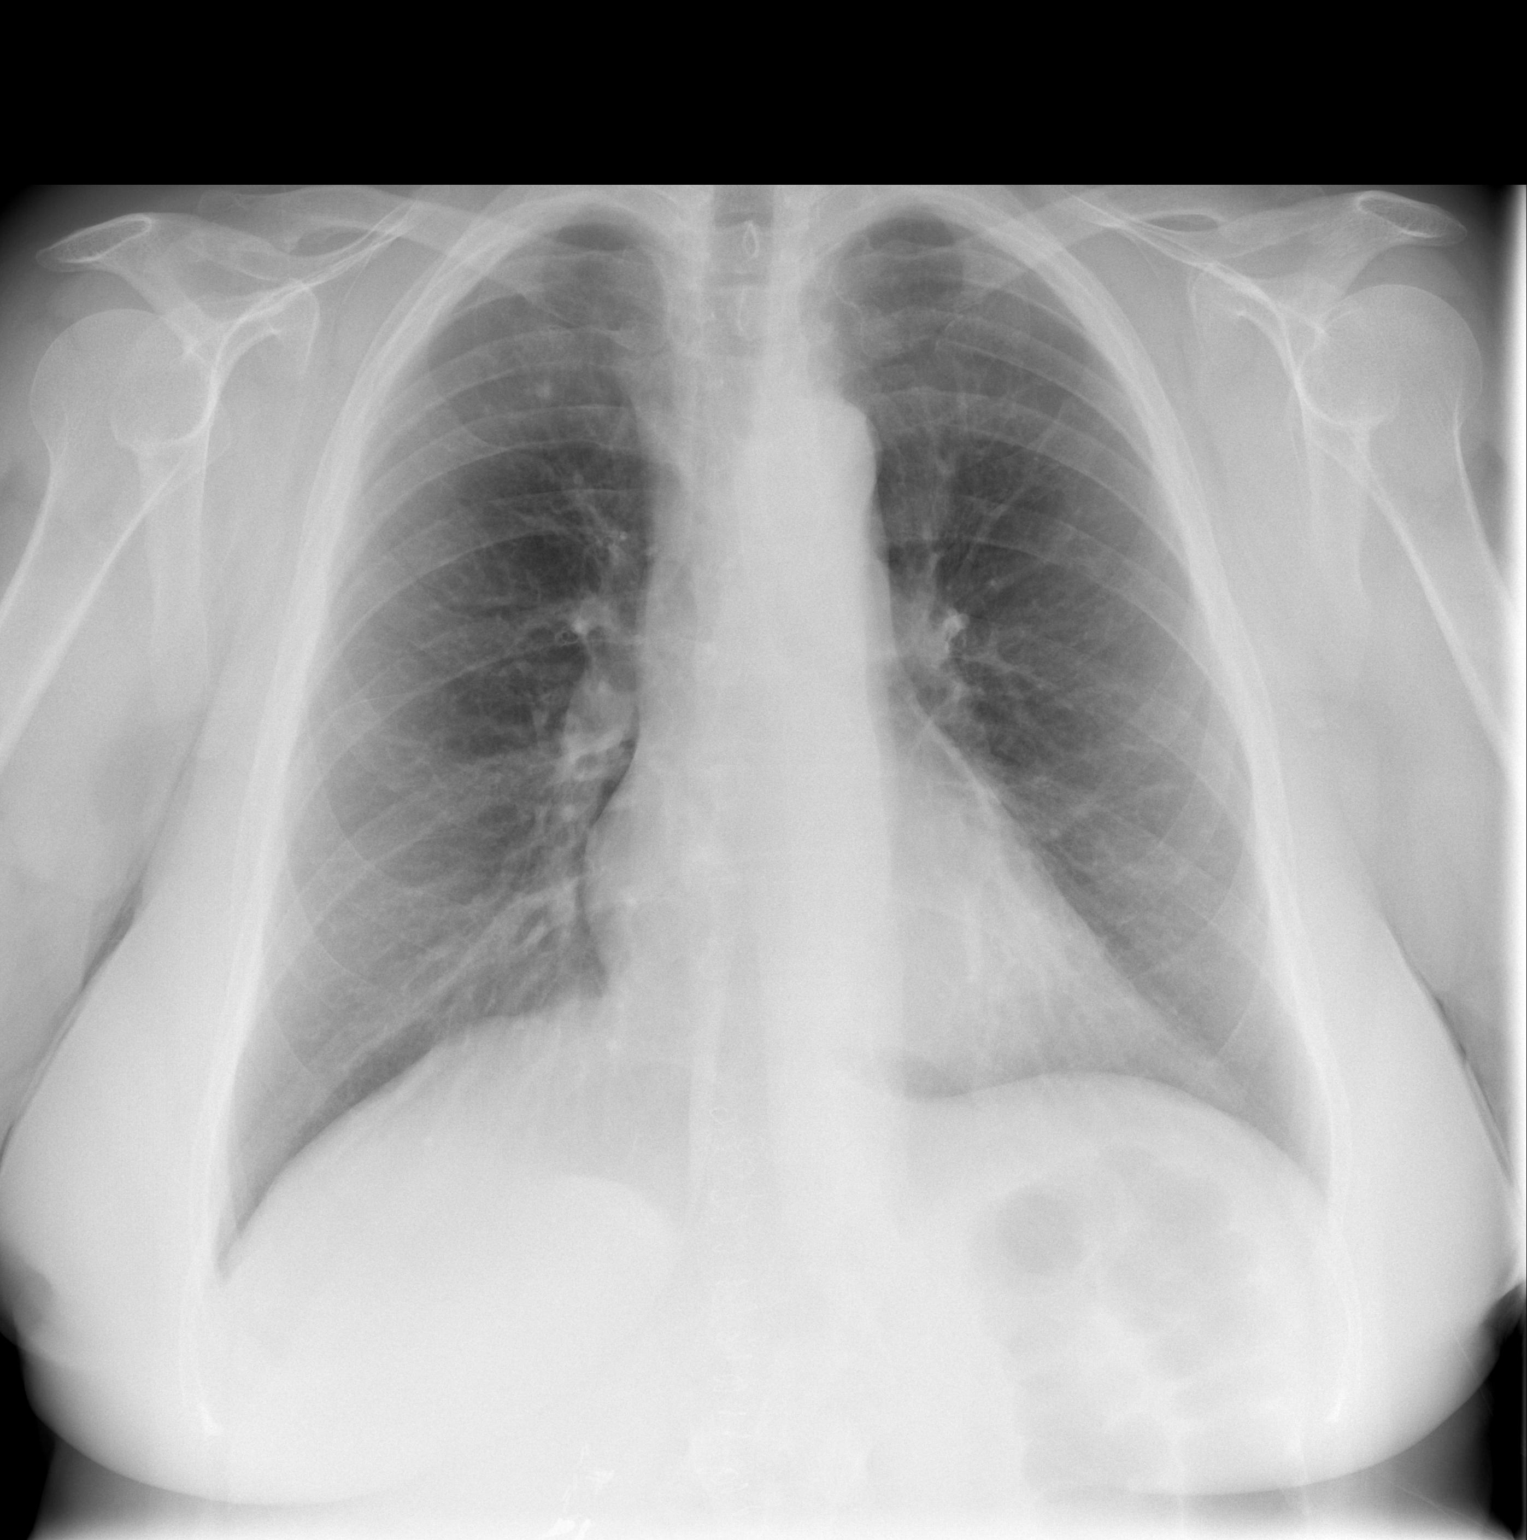

[w chest lat]
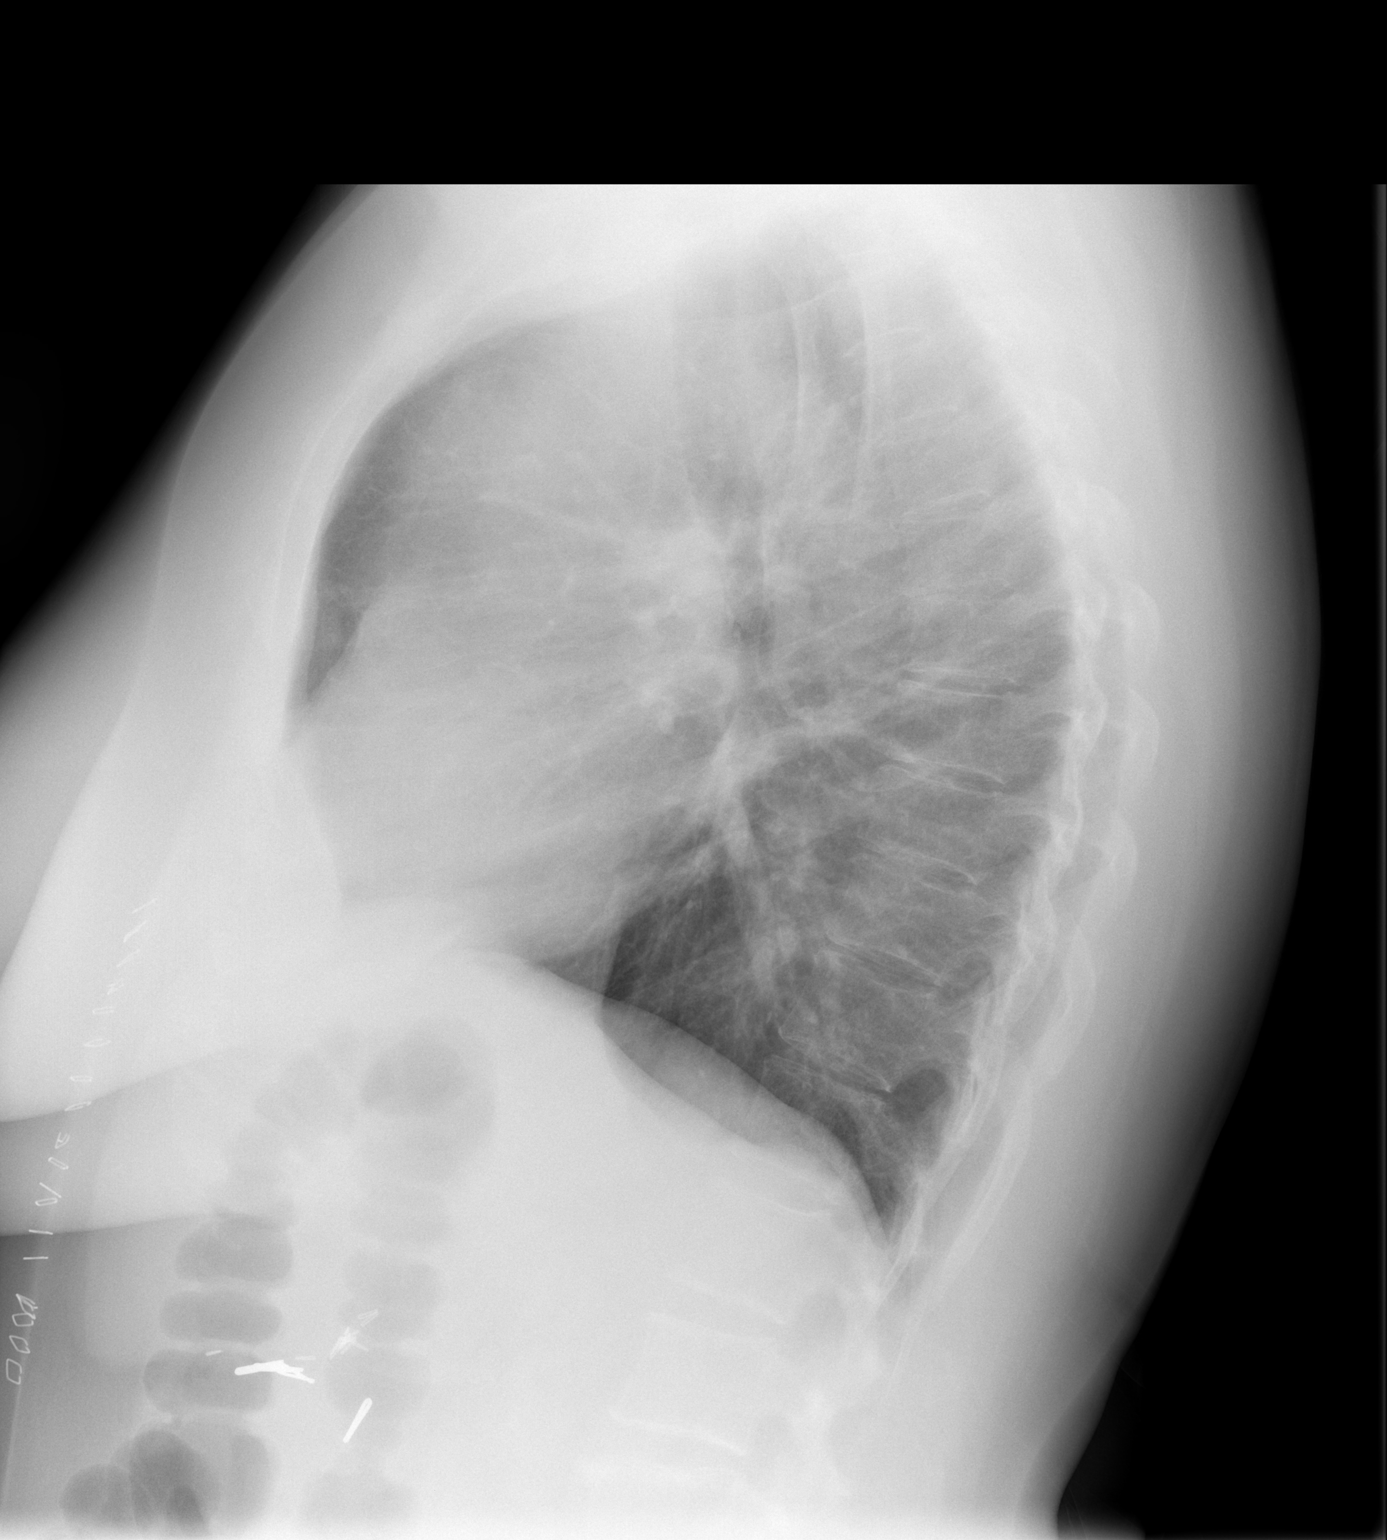

[2 of 2 positions shown; findings below may reference images not displayed]

FINDINGS: Heart is normal in size.  Lungs are clear other than a
calcified granuloma in the right upper lobe.  Increased AP diameter
of the chest and hyperaeration likely related to COPD.  No pleural
fluid.  No pneumothorax.
IMPRESSION: No active cardiopulmonary disease.  Chronic changes.

## 2013-06-08 ENCOUNTER — Ambulatory Visit (INDEPENDENT_AMBULATORY_CARE_PROVIDER_SITE_OTHER): Payer: Medicare Other | Admitting: Critical Care Medicine

## 2013-06-08 ENCOUNTER — Encounter: Payer: Self-pay | Admitting: Critical Care Medicine

## 2013-06-08 ENCOUNTER — Ambulatory Visit (HOSPITAL_BASED_OUTPATIENT_CLINIC_OR_DEPARTMENT_OTHER)
Admission: RE | Admit: 2013-06-08 | Discharge: 2013-06-08 | Disposition: A | Discharge status: Home / Self Care | Payer: Medicare Other | Source: Ambulatory Visit | Attending: Critical Care Medicine | Admitting: Critical Care Medicine

## 2013-06-08 VITALS — BP 138/70 | HR 82 | Temp 97.7°F | Ht 68.0 in | Wt 199.0 lb

## 2013-06-08 DIAGNOSIS — R059 Cough, unspecified: Secondary | ICD-10-CM | POA: Insufficient documentation

## 2013-06-08 DIAGNOSIS — R918 Other nonspecific abnormal finding of lung field: Secondary | ICD-10-CM

## 2013-06-08 DIAGNOSIS — R05 Cough: Secondary | ICD-10-CM | POA: Insufficient documentation

## 2013-06-08 MED ORDER — CHLORPHENIRAMINE MALEATE 4 MG PO TABS: 4.0000 mg | ORAL_TABLET | Freq: Two times a day (BID) | ORAL | Status: DC | PRN

## 2013-06-08 MED ORDER — RABEPRAZOLE SODIUM 20 MG PO TBEC: 20.0000 mg | DELAYED_RELEASE_TABLET | Freq: Every day | ORAL | Status: DC

## 2013-06-08 MED ORDER — BENZONATATE 200 MG PO CAPS: ORAL_CAPSULE | ORAL | Status: DC

## 2013-06-08 MED ORDER — DEXTROMETHORPHAN POLISTIREX 30 MG/5ML PO LQCR: 30.0000 mg | ORAL | Status: DC | PRN

## 2013-06-08 NOTE — Progress Notes (Signed)
  Subjective:    Patient ID: Claudia Joseph, female    DOB: October 10, 1945, 68 y.o.   MRN: [id number]  HPI   06/08/2013 Chief Complaint  Patient presents with  . Acute Visit    Was treated by PCP x 3 wks ago for URI.  Still coughing with white mucus.  Slight chest tightness.  DOE at baseline. No f/c/s.  URI started 3-4 weeks ago: symptoms, cough, prod yellow, saw PCP rx augmentin/pred.  This helped but still coughing. Mold issue was abated.  No heartburn or indigestion.  Clearing throat a lot, still with pndrip.  No sore throat. Hoarseness.  Notes DOE if walks a lot.    Review of Systems  11 pt Ros neg.except as above    Objective:   Physical Exam  Filed Vitals:   06/08/13 0903  BP: 138/70  Pulse: 82  Temp: 97.7 F (36.5 C)  TempSrc: Oral  Height: 5\' 8"  (1.727 m)  Weight: 199 lb (90.266 kg)  SpO2: 97%    Gen: Pleasant, well-nourished, in no distress,  normal affect  ENT: No lesions,  mouth clear,  oropharynx clear, +++ postnasal drip  Neck: No JVD, no TMG, no carotid bruits  Lungs: No use of accessory muscles, no dullness to percussion, clear without rales or rhonchi  Cardiovascular: RRR, heart sounds normal, no murmur or gallops, no peripheral edema  Abdomen: soft and NT, no HSM,  BS normal  Musculoskeletal: No deformities, no cyanosis or clubbing  Neuro: alert, non focal  Skin: Warm, no lesions or rashes  No results found.        Assessment & Plan:   Cough Cyclic cough d/t upper airway issues and Gerd Plan Cough protocol PPI Nasal steroid/antihistamine     Updated Medication List Outpatient Encounter Prescriptions as of 06/08/2013  Medication Sig  . amLODipine (NORVASC) 5 MG tablet Take 2 tablets by mouth Daily.   . fluticasone (FLONASE) 50 MCG/ACT nasal spray Place 1 spray into the nose daily as needed.   . Glucosamine-Chondroitin (MOVE FREE PO) Take 2 tablets by mouth daily.  . hydrochlorothiazide (HYDRODIURIL) 25 MG tablet Take 1 tablet by  mouth daily.  . potassium chloride (K-DUR,KLOR-CON) 10 MEQ tablet Take 1 tablet by mouth 2 (two) times daily.  . RABEprazole (ACIPHEX) 20 MG tablet Take 1 tablet (20 mg total) by mouth daily.  Marland Kitchen triamcinolone (KENALOG) 0.1 % paste as needed.  . [DISCONTINUED] RABEprazole (ACIPHEX) 20 MG tablet Take 20 mg by mouth daily as needed.   . benzonatate (TESSALON) 200 MG capsule Use per cough protocol 1 every 4-6 hours  . chlorpheniramine (CHLOR-TRIMETON) 4 MG tablet Take 1 tablet (4 mg total) by mouth 2 (two) times daily as needed for allergies.  Marland Kitchen dextromethorphan (DELSYM) 30 MG/5ML liquid Take 5 mLs (30 mg total) by mouth as needed for cough (per cough protocol).  . [DISCONTINUED] CASCARA SAGRADA PO Take by mouth as needed.  . [DISCONTINUED] dexlansoprazole (DEXILANT) 60 MG capsule Take 60 mg by mouth daily as needed.

## 2013-06-08 NOTE — Patient Instructions (Signed)
Use Delsym/benzonatate for cough protocol Use chlorpheniramine 12mg  every night for two weeks Use flonase daily 2 puff ea nostril Aciphex daily CT Chest ordered Return 3 months

## 2013-06-08 NOTE — Assessment & Plan Note (Signed)
Repeat ct chest  

## 2013-06-08 NOTE — Assessment & Plan Note (Signed)
Cyclic cough d/t upper airway issues and Gerd Plan Cough protocol PPI Nasal steroid/antihistamine

## 2013-08-22 DIAGNOSIS — D229 Melanocytic nevi, unspecified: Secondary | ICD-10-CM | POA: Insufficient documentation

## 2013-09-27 ENCOUNTER — Telehealth: Payer: Self-pay | Admitting: *Deleted

## 2013-09-27 NOTE — Telephone Encounter (Signed)
LMOM TCB x1 for pt at home

## 2013-09-27 NOTE — Telephone Encounter (Signed)
Message copied by Adalberto Ill on Wed Sep 27, 2013  1:18 PM ------      Message from: Elsie Stain      Created: Tue Sep 26, 2013 12:58 PM      Regarding: ct chest        Time for another CT Chest NO Contrast, f/u lung nodules            pw      ----- Message -----         From: Elsie Stain, MD         Sent: 10/04/2012   9:18 AM           To: Elsie Stain, MD            chk ct chest       ------

## 2013-09-27 NOTE — Telephone Encounter (Signed)
lmomtcb for pt on home and cell #s - would pt like to have scan at [facility]?

## 2013-09-27 NOTE — Telephone Encounter (Signed)
Pt returned call Advised PW recommends to have another CT Chest done to follow up on her lung nodules.  Pt stated that per her 05/2013 CT Chest, she was told no other CT is needed:  Result Notes    Notes Recorded by Elsie Stain, MD on 06/08/2013 at 12:26 PM Pt aware. Nodules benign No further scanning needed   Dr Joya Gaskins please advise, thank you.

## 2013-09-27 NOTE — Telephone Encounter (Signed)
Tell pt to forgive me,  We are ok to NOT scan further

## 2013-09-28 NOTE — Telephone Encounter (Signed)
Called, spoke with pt -  Informed her PW ok with NOT doing further scan at this time.  She verbalized understanding. Pt requesting to schedule f/u -- scheduled for Oct 19, 2013 at 9:45 am in [place].  Pt confirmed appt date, time, and location and voiced no further questions or concerns at this time.

## 2013-09-28 NOTE — Telephone Encounter (Signed)
Pt returned call [phone]

## 2013-09-28 NOTE — Telephone Encounter (Signed)
lmomtcb for pt 

## 2013-10-19 ENCOUNTER — Encounter: Payer: Self-pay | Admitting: Critical Care Medicine

## 2013-10-19 ENCOUNTER — Ambulatory Visit (INDEPENDENT_AMBULATORY_CARE_PROVIDER_SITE_OTHER): Payer: Medicare Other | Admitting: Critical Care Medicine

## 2013-10-19 VITALS — BP 128/83 | HR 78 | Temp 97.6°F | Ht 68.0 in | Wt 201.0 lb

## 2013-10-19 DIAGNOSIS — R05 Cough: Secondary | ICD-10-CM

## 2013-10-19 DIAGNOSIS — R059 Cough, unspecified: Secondary | ICD-10-CM

## 2013-10-19 DIAGNOSIS — E669 Obesity, unspecified: Secondary | ICD-10-CM | POA: Insufficient documentation

## 2013-10-19 MED ORDER — BENZONATATE 200 MG PO CAPS: ORAL_CAPSULE | ORAL | Status: DC

## 2013-10-19 MED ORDER — PREDNISONE 10 MG PO TABS: ORAL_TABLET | ORAL | Status: DC

## 2013-10-19 MED ORDER — CHLORPHENIRAMINE MALEATE 4 MG PO TABS: ORAL_TABLET | ORAL | Status: DC

## 2013-10-19 MED ORDER — FLUTICASONE PROPIONATE 50 MCG/ACT NA SUSP: 2.0000 | Freq: Every day | NASAL | Status: DC

## 2013-10-19 NOTE — Patient Instructions (Signed)
Get your roof repaired.  Chlorpheniramine 4mg  : three at bedtime Flonase two puff ea nostril daily Prednisone 10mg  Take 4 for two days three for two days two for two days one for two days Cough protocol with Delsym and tessalon perles Voice rest, sugar free candy per protocol Return 6 months or as needed I can write a letter stating home repair is a medical necessity  Get flu vaccine this fall

## 2013-10-19 NOTE — Assessment & Plan Note (Signed)
Cyclic cough on the basis of upper airway instability and postnasal drip syndrome and associated reflux disease now with mild exacerbation Plan Chlorpheniramine 4mg  : three at bedtime Flonase two puff ea nostril daily Prednisone 10mg  Take 4 for two days three for two days two for two days one for two days Cough protocol with Delsym and tessalon perles Voice rest, sugar free candy per protocol Return 6 months or as needed I can write a letter stating home repair is a medical necessity  Get flu vaccine this fall

## 2013-10-19 NOTE — Progress Notes (Signed)
Subjective:    Patient ID: Claudia Joseph, female    DOB: 1945/04/10, 68 y.o.   MRN: [id number]  HPI  10/19/2013 Chief Complaint  Patient presents with  . Follow-up    Pt states she still has a productive cough with clear phlegm that she feels is the same as last visit. She states she is having increased PND.   CT scan was unchanged and benign.   Pt still with a cough worse since change in weather.   Cough is dry, tickle.  PNdrip noted.  No sore throat, but tickles and is scratchy.  On PPI daily, no GERD.  No burping. No dysphagia.  No qhs reflux.  No real DOE unless up and down steps.    Review of Systems  11 pt Ros neg.except as above    Objective:   Physical Exam  Filed Vitals:   10/19/13 0949  BP: 128/83  Pulse: 78  Temp: 97.6 F (36.4 C)  TempSrc: Oral  Height: 5\' 8"  (1.727 m)  Weight: 201 lb (91.173 kg)  SpO2: 99%    Gen: Pleasant, moderate obesity, in no distress,  normal affect  ENT: No lesions,  mouth clear,  oropharynx clear, +++ postnasal drip  Neck: No JVD, no TMG, no carotid bruits  Lungs: No use of accessory muscles, no dullness to percussion, mild pseudo-wheeze  Cardiovascular: RRR, heart sounds normal, no murmur or gallops, no peripheral edema  Abdomen: soft and NT, no HSM,  BS normal  Musculoskeletal: No deformities, no cyanosis or clubbing  Neuro: alert, non focal  Skin: Warm, no lesions or rashes  No results found.        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 and postnasal drip syndrome and associated reflux disease now with mild exacerbation Plan Chlorpheniramine 4mg  : three at bedtime Flonase two puff ea nostril daily Prednisone 10mg  Take 4 for two days three for two days two for two days one for two days Cough protocol with Delsym and tessalon perles Voice rest, sugar free candy per protocol Return 6 months or as needed I can write a letter stating home repair is a medical necessity   Get flu vaccine this fall   Dietary weight loss strategies was advised  Updated Medication List Outpatient Encounter Prescriptions as of 10/19/2013  Medication Sig  . amLODipine (NORVASC) 10 MG tablet Take 10 mg by mouth daily.  . benzonatate (TESSALON) 200 MG capsule Use per cough protocol 1 every 4-6 hours  . dextromethorphan (DELSYM) 30 MG/5ML liquid Take 5 mLs (30 mg total) by mouth as needed for cough (per cough protocol).  . fluticasone (FLONASE) 50 MCG/ACT nasal spray Place 2 sprays into both nostrils daily.  . Glucosamine-Chondroitin (MOVE FREE PO) Take 2 tablets by mouth daily.  . hydrochlorothiazide (HYDRODIURIL) 25 MG tablet Take 1 tablet by mouth daily.  . Magnesium 400 MG CAPS Take 1 capsule by mouth daily.  . potassium chloride (K-DUR,KLOR-CON) 10 MEQ tablet Take 1 tablet by mouth 2 (two) times daily.  . RABEprazole (ACIPHEX) 20 MG tablet Take 1 tablet (20 mg total) by mouth daily.  Marland Kitchen triamcinolone (KENALOG) 0.1 % paste as needed.  . [DISCONTINUED] amLODipine (NORVASC) 5 MG tablet Take 2 tablets by mouth Daily.   . [DISCONTINUED] benzonatate (TESSALON) 200 MG capsule Use per cough protocol 1 every 4-6 hours  . [DISCONTINUED] chlorpheniramine (CHLOR-TRIMETON) 4 MG tablet Take 1 tablet (4 mg total) by mouth 2 (two) times daily  as needed for allergies.  . [DISCONTINUED] fluticasone (FLONASE) 50 MCG/ACT nasal spray Place 1 spray into the nose daily as needed.   . chlorpheniramine (CHLOR-TRIMETON) 4 MG tablet 12 mg at bedtime  . predniSONE (DELTASONE) 10 MG tablet Take 4 for two days three for two days two for two days one for two days

## 2015-06-24 ENCOUNTER — Ambulatory Visit (INDEPENDENT_AMBULATORY_CARE_PROVIDER_SITE_OTHER): Payer: Medicare Other | Admitting: Sports Medicine

## 2015-06-24 ENCOUNTER — Ambulatory Visit (INDEPENDENT_AMBULATORY_CARE_PROVIDER_SITE_OTHER): Payer: Medicare Other

## 2015-06-24 ENCOUNTER — Encounter: Payer: Self-pay | Admitting: Sports Medicine

## 2015-06-24 VITALS — BP 146/82 | HR 59 | Resp 18 | Wt 199.7 lb

## 2015-06-24 DIAGNOSIS — M79671 Pain in right foot: Secondary | ICD-10-CM | POA: Diagnosis not present

## 2015-06-24 DIAGNOSIS — M25571 Pain in right ankle and joints of right foot: Secondary | ICD-10-CM

## 2015-06-24 MED ORDER — MELOXICAM 15 MG PO TABS: ORAL_TABLET | ORAL | Status: DC

## 2015-06-24 NOTE — Assessment & Plan Note (Signed)
Pain over the third and fourth metatarsal shaft dorsally after a trip to [place]. Suspect metatarsal stress injury. Meloxicam, x-rays, postop shoe. Return for custom orthotics.  She also had some fractures of the metatarsal heads per her report, x-rays will tell as there is any post traumatic osteoarthritis.

## 2015-06-24 NOTE — Progress Notes (Signed)
  Subjective:    CC: Foot pain  HPI:  Right foot pain: Moderate, persistent, localized over the third and fourth metatarsal shafts after walking around in [place] on vacation. No radiation, history of distal metatarsal fractures.  Past medical history, Surgical history, Family history not pertinant except as noted below, Social history, Allergies, and medications have been entered into the medical record, reviewed, and no changes needed.   Review of Systems: No headache, visual changes, nausea, vomiting, diarrhea, constipation, dizziness, abdominal pain, skin rash, fevers, chills, night sweats, swollen lymph nodes, weight loss, chest pain, body aches, joint swelling, muscle aches, shortness of breath, mood changes, visual or auditory hallucinations.  Objective:    General: Well Developed, well nourished, and in no acute distress.  Neuro: Alert and oriented x3, extra-ocular muscles intact, sensation grossly intact.  HEENT: Normocephalic, atraumatic, pupils equal round reactive to light, neck supple, no masses, no lymphadenopathy, thyroid nonpalpable.  Skin: Warm and dry, no rashes noted.  Cardiac: Regular rate and rhythm, no murmurs rubs or gallops.  Respiratory: Clear to auscultation bilaterally. Not using accessory muscles, speaking in full sentences.  Abdominal: Soft, nontender, nondistended, positive bowel sounds, no masses, no organomegaly.  Right Foot: No visible erythema or swelling. Range of motion is full in all directions. Strength is 5/5 in all directions. No hallux valgus. No pes cavus or pes planus. No abnormal callus noted. No pain over the navicular prominence, or base of fifth metatarsal. No tenderness to palpation of the calcaneal insertion of plantar fascia. No pain at the Achilles insertion. No pain over the calcaneal bursa. No pain of the retrocalcaneal bursa. Tender to palpation over the third and fourth metatarsal shafts No hallux rigidus or limitus. No  tenderness palpation over interphalangeal joints. No pain with compression of the metatarsal heads. Neurovascularly intact distally.  Impression and Recommendations:    The patient was counselled, risk factors were discussed, anticipatory guidance given.

## 2015-06-27 ENCOUNTER — Ambulatory Visit (INDEPENDENT_AMBULATORY_CARE_PROVIDER_SITE_OTHER): Payer: Medicare Other | Admitting: Sports Medicine

## 2015-06-27 ENCOUNTER — Encounter: Payer: Self-pay | Admitting: Sports Medicine

## 2015-06-27 DIAGNOSIS — M79671 Pain in right foot: Secondary | ICD-10-CM | POA: Diagnosis not present

## 2015-06-27 MED ORDER — DIAZEPAM 5 MG PO TABS: ORAL_TABLET | ORAL | Status: DC

## 2015-06-27 NOTE — Assessment & Plan Note (Signed)
Custom orthotics. Ingrown right great toenail with onychodystrophy. Return for toenail excision, she will call to make sure that we have phenol in stock before making her appointment. Adding Valium for preprocedural anxiolysis and sedation.

## 2015-06-27 NOTE — Progress Notes (Signed)

## 2015-07-16 ENCOUNTER — Encounter: Payer: Self-pay | Admitting: Pulmonary Disease

## 2015-07-16 ENCOUNTER — Ambulatory Visit (INDEPENDENT_AMBULATORY_CARE_PROVIDER_SITE_OTHER): Payer: Medicare Other | Admitting: Pulmonary Disease

## 2015-07-16 VITALS — BP 126/74 | HR 59 | Ht 68.0 in | Wt 201.4 lb

## 2015-07-16 DIAGNOSIS — R05 Cough: Secondary | ICD-10-CM | POA: Diagnosis not present

## 2015-07-16 DIAGNOSIS — R0600 Dyspnea, unspecified: Secondary | ICD-10-CM | POA: Insufficient documentation

## 2015-07-16 DIAGNOSIS — J302 Other seasonal allergic rhinitis: Secondary | ICD-10-CM | POA: Insufficient documentation

## 2015-07-16 DIAGNOSIS — J309 Allergic rhinitis, unspecified: Secondary | ICD-10-CM

## 2015-07-16 DIAGNOSIS — R059 Cough, unspecified: Secondary | ICD-10-CM

## 2015-07-16 NOTE — Patient Instructions (Signed)
   Call me if you have any new breathing problems or new cough before your next appointment.  Don't forget to come back and get your blood work done.  I'll see you back in 3 months or sooner if needed.  TESTS ORDERED: 1. Full pulmonary function testing at follow-up appointment 2. Serum CBC, IgE, & RAST panel

## 2015-07-16 NOTE — Progress Notes (Signed)
Subjective:    Patient ID: Claudia Joseph, female    DOB: 1945/10/03, 70 y.o.   MRN: [id number]  C.C.:  Follow-up for Dyspnea, Cough, & Allergic Rhinitis.  HPI  Dyspnea:  Reports baseline dyspnea on exertion especially going up stairs. Denies any wheezing.   Cough: Previously seen by Dr. Joya Gaskins. Thought to be secondary to postnasal drainage. She reports she continues to have intermittent coughing that is triggered by a "tickle" in her throat. She notices her cough more with sinus congestion.   Review of Systems She reports she had frequent nausea & diarrhea 2 weeks ago. No fever, chills, or sweats. No chest pain or pressure.   Allergies  Allergen Reactions  . Meperidine Hcl     Demerol causes severe HAs    Current Outpatient Prescriptions on File Prior to Visit  Medication Sig Dispense Refill  . amLODipine (NORVASC) 10 MG tablet Take 10 mg by mouth daily.    . hydrochlorothiazide (HYDRODIURIL) 25 MG tablet Take 1 tablet by mouth daily.    . potassium chloride (K-DUR,KLOR-CON) 10 MEQ tablet Take 1 tablet by mouth 2 (two) times daily.     No current facility-administered medications on file prior to visit.    Past Medical History  Diagnosis Date  . Head pain, chronic     history of  . Hypertension   . Hernia   . GERD (gastroesophageal reflux disease)     Past Surgical History  Procedure Laterality Date  . Cholecystectomy    . Tubal ligation    . Appendectomy    . Tonsillectomy    . Colonoscopy      Family History  Problem Relation Age of Onset  . Leukemia Father     deceased  . Pancreatic cancer Maternal Grandmother   . Tongue cancer Brother   . Lung disease Neg Hx     Social History   Social History  . Marital Status: Married    Spouse Name: N/A  . Number of Children: 3  . Years of Education: N/A   Occupational History  . Dental Office    Social History Main Topics  . Smoking status: Never Smoker   . Smokeless tobacco: Never Used  . Alcohol Use: No    . Drug Use: No  . Sexual Activity: Not Asked   Other Topics Concern  . None   Social History Narrative   Originally from [place]. Previously had mold exposure in her home that underwent professional remediation. 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 No pets currently. No bird or hot tub exposure. 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       Objective:   Physical Exam BP 126/74 mmHg  Pulse 59  Ht 5\' 8"  (1.727 m)  Wt 201 lb 6.4 oz (91.354 kg)  BMI 30.63 kg/m2  SpO2 98% General:  Awake. Alert. Nodistress. Mild central obesity. Integument:  Warm & dry. No rash on exposed skin. No bruising. HEENT:  Moist mucus membranes. No oral ulcers. No scleral injection or icterus. Mild bilateral nasal turbinate swelling. Cardiovascular:  Regular rate. No edema. No appreciable JVD.  Pulmonary:  Good aeration & clear to auscultation bilaterally. Symmetric chest wall expansion. No accessory muscle use. Abdomen: Soft. Normal bowel sounds. Nondistended. Grossly nontender. Musculoskeletal:  Normal bulk and tone.No joint deformity or effusion appreciated.  PFT 04/27/11: FVC 2.98 L (87%) FEV1 2.47 L (94%) FEV1/FVC 0.83 FEF 25-75 2.79 L (124%)  IMAGING CT CHEST W/O  06/08/13 (per radiologist): Two 4 mm nodules left lower lobe unchanged from prior CT imaging. 3 mm left upper lobe nodule unchanged. No new nodule. No axillary or supraclavicular lymphadenopathy. No mediastinal or hilar adenopathy. Nodule stable going back to 10/08/11.  LABS 04/27/11 IgE: 12.8 Hypersensitivity Pneumonitis Panel:  Aspergillus pullulans 1 band detected    Assessment & Plan:  70 year old female with ongoing intermittent cough, allergic rhinitis, and dyspnea on exertion. Patient likely has significant seasonal allergies contributing to all of her symptoms. I am suspicious about underlying asthma despite her previous spirometry which was normal. I instructed the patient to contact my office if  she had any new breathing problems or questions before next appointment. I am holding on initiating any medications at this time inhaler or pill form.  1. Dyspnea: Likely multifactorial. Checking full pulmonary function testing at follow-up appointment. 2. Cough: Suspect secondary to allergic rhinitis. 3. Allergic rhinitis: Checking serum CBC, IgE, & RAST panel. 4. Follow-up: Patient to return to clinic in 3 months or sooner if needed.  Sonia Baller Ashok Cordia, M.D. [facility] Pulmonary & Critical Care Pager:  [phone] After 3pm or if no response, call [phone] 4:36 PM 07/16/2015

## 2015-07-18 ENCOUNTER — Other Ambulatory Visit (INDEPENDENT_AMBULATORY_CARE_PROVIDER_SITE_OTHER): Payer: Medicare Other

## 2015-07-18 DIAGNOSIS — R05 Cough: Secondary | ICD-10-CM

## 2015-07-18 DIAGNOSIS — R059 Cough, unspecified: Secondary | ICD-10-CM

## 2015-07-18 DIAGNOSIS — R0600 Dyspnea, unspecified: Secondary | ICD-10-CM

## 2015-07-18 LAB — CBC WITH DIFFERENTIAL/PLATELET
BASOS PCT: 0.9 % (ref 0.0–3.0)
Basophils Absolute: 0.1 10*3/uL (ref 0.0–0.1)
EOS PCT: 2.9 % (ref 0.0–5.0)
Eosinophils Absolute: 0.2 10*3/uL (ref 0.0–0.7)
HCT: 42.3 % (ref 36.0–46.0)
HEMOGLOBIN: 14.3 g/dL (ref 12.0–15.0)
LYMPHS ABS: 3.1 10*3/uL (ref 0.7–4.0)
Lymphocytes Relative: 38.3 % (ref 12.0–46.0)
MCHC: 33.9 g/dL (ref 30.0–36.0)
MCV: 90.2 fl (ref 78.0–100.0)
MONO ABS: 0.5 10*3/uL (ref 0.1–1.0)
Monocytes Relative: 6.4 % (ref 3.0–12.0)
NEUTROS ABS: 4.2 10*3/uL (ref 1.4–7.7)
NEUTROS PCT: 51.5 % (ref 43.0–77.0)
PLATELETS: 321 10*3/uL (ref 150.0–400.0)
RBC: 4.69 Mil/uL (ref 3.87–5.11)
RDW: 12.8 % (ref 11.5–15.5)
WBC: 8.1 10*3/uL (ref 4.0–10.5)

## 2015-07-19 LAB — RESPIRATORY ALLERGY PROFILE REGION II ~~LOC~~
Allergen, Cedar tree, t12: <0.1 kU/L
Allergen, Cottonwood, t14: <0.1 kU/L
Allergen, Mouse Urine Protein, e78: <0.1 kU/L
Aspergillus fumigatus, m3: <0.1 kU/L
Bermuda Grass: <0.1 kU/L
Cat Dander: <0.1 kU/L
Common Ragweed: <0.1 kU/L
IGE (IMMUNOGLOBULIN E), SERUM: 18 kU/L (ref <115)
Pecan/Hickory Tree IgE: <0.1 kU/L
Penicillium Notatum: <0.1 kU/L
Timothy Grass: <0.1 kU/L

## 2015-08-26 ENCOUNTER — Encounter: Payer: Self-pay | Admitting: Sports Medicine

## 2015-08-26 ENCOUNTER — Ambulatory Visit (INDEPENDENT_AMBULATORY_CARE_PROVIDER_SITE_OTHER): Payer: Medicare Other | Admitting: Sports Medicine

## 2015-08-26 DIAGNOSIS — M170 Bilateral primary osteoarthritis of knee: Secondary | ICD-10-CM | POA: Insufficient documentation

## 2015-08-26 DIAGNOSIS — M1711 Unilateral primary osteoarthritis, right knee: Secondary | ICD-10-CM

## 2015-08-26 NOTE — Progress Notes (Addendum)
  Subjective:    CC: R knee pain   HPI: 70 yo presenting with multiple months of intermittent R knee pain.  She says pain is localized behind her posterior knee and radiates up her distal hamstring.  No radicular symptoms.  She says pain comes and goes and is associated with intermittent R knee "clicking".  When her knee clicks, she has trouble bending her knee.  Knee will then "uncatch" and she is able to walk again. She has not tried anything for pain.  She is worried she has a Baker's cyst because her husband had one last month.    Past medical history, Surgical history, Family history not pertinant except as noted below, Social history, Allergies, and medications have been entered into the medical record, reviewed, and no changes needed.   Review of Systems: No fevers, chills, night sweats, weight loss, chest pain, or shortness of breath.   Objective:    General: Well Developed, well nourished, and in no acute distress.  Neuro: Alert and oriented x3, extra-ocular muscles intact, sensation grossly intact.  HEENT: Normocephalic, atraumatic, pupils equal round reactive to light, neck supple, no masses, no lymphadenopathy, thyroid nonpalpable.  Skin: Warm and dry, no rashes. Cardiac: Regular rate and rhythm, no murmurs rubs or gallops, no lower extremity edema.  Respiratory: Clear to auscultation bilaterally. Not using accessory muscles, speaking in full sentences. R Knee: Normal to inspection with no erythema or effusion or obvious bony abnormalities. Palpation normal with no warmth, joint line tenderness, patellar tenderness, or condyle tenderness. ROM full in flexion and extension and lower leg rotation. Ligaments with solid consistent endpoints including ACL, PCL, LCL, MCL. Negative Mcmurray's, Apley's, and Thessalonian tests. Non painful patellar compression. Patellar glide without crepitus. Patellar and quadriceps tendons unremarkable. Hamstring and quadriceps strength is normal.    Procedure: Real-time Ultrasound Guided Injection of right knee Device: GE Logiq E  Verbal informed consent obtained.  Time-out conducted.  Noted no overlying erythema, induration, or other signs of local infection.  Skin prepped in a sterile fashion.  Local anesthesia: Topical Ethyl chloride.  With sterile technique and under real time ultrasound guidance: I scanned the back of the knee and I did see a small but asymptomatic Baker's cyst medially, we then focused on the front of the knee, needle advanced into the suprapatellar recess and 1 mL kenalog 40, 2 mL lidocaine, 2 mL Marcaine injected easily.   Completed without difficulty  Pain immediately resolved suggesting accurate placement of the medication.  Advised to call if fevers/chills, erythema, induration, drainage, or persistent bleeding.  Images permanently stored and available for review in the ultrasound unit.  Impression: Technically successful ultrasound guided injection.  Impression and Recommendations:    1. 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 ___________________________________________ Gwen Her. Dianah Field, M.D., ABFM., CAQSM. Primary Care and [place] Instructor of [place] [facility]

## 2015-08-26 NOTE — Assessment & Plan Note (Signed)
Likely with a torn posterior lateral meniscal tear. Injection as above. Rehabilitation exercises given, return in one month.

## 2015-10-04 ENCOUNTER — Ambulatory Visit: Payer: Medicare Other | Admitting: Sports Medicine

## 2015-10-09 ENCOUNTER — Telehealth: Payer: Self-pay | Admitting: Pulmonary Disease

## 2015-10-09 ENCOUNTER — Ambulatory Visit (INDEPENDENT_AMBULATORY_CARE_PROVIDER_SITE_OTHER): Payer: Medicare Other | Admitting: Pulmonary Disease

## 2015-10-09 ENCOUNTER — Encounter: Payer: Self-pay | Admitting: Pulmonary Disease

## 2015-10-09 VITALS — BP 130/78 | HR 85 | Ht 68.0 in | Wt 201.0 lb

## 2015-10-09 DIAGNOSIS — R059 Cough, unspecified: Secondary | ICD-10-CM

## 2015-10-09 DIAGNOSIS — R05 Cough: Secondary | ICD-10-CM | POA: Diagnosis not present

## 2015-10-09 DIAGNOSIS — J4521 Mild intermittent asthma with (acute) exacerbation: Secondary | ICD-10-CM | POA: Diagnosis not present

## 2015-10-09 MED ORDER — PREDNISONE 10 MG PO TABS: ORAL_TABLET | ORAL | 0 refills | Status: DC

## 2015-10-09 NOTE — Telephone Encounter (Signed)
Is there anyone who has an open appointment today that can see her? If not let me know.

## 2015-10-09 NOTE — Telephone Encounter (Signed)
Pt c/o cough with PND/congestion, yellow mucus production and heaviness in chest x 3 days.  Pt states that she started taking some Amoxicillin 500mg  BID that she had on hand at home - pt is on day 2 of this.  Pt is requesting an OV today with Dr Ashok Cordia - no openings today.  Please advise Dr Ashok Cordia, thanks.

## 2015-10-09 NOTE — Patient Instructions (Signed)
Complete amoxicillin 500 mg thrice daily for 3 more days Prednisone 10 mg tabs  Take 2 tabs daily with food x 5ds, then 1 tab daily with food x 5ds then STOP  Okay to take Delsym cough syrup as needed twice daily

## 2015-10-09 NOTE — Telephone Encounter (Signed)
Dr Elsworth Soho has an openings this morning at 11:30 - pt scheduled in this slot. Nothing further needed.

## 2015-10-09 NOTE — Progress Notes (Signed)
   Subjective:    Patient ID: Claudia Joseph, female    DOB: 15-May-1945, 70 y.o.   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 There was some suspicion about underlying asthma despite her previous spirometry which was normal  Chief Complaint  Patient presents with  . Acute Visit    coughing up yellow mucus, no fever, having chills.  head congestion/ chest congestion   She reports acute onset of sore throat followed by cough with yellow mucus and intermittent wheezing. For her prior episodes of bronchitis she has received steroid pack. She would like to catch this early and prevent it from getting worse. She started using some leftover amoxicillin from her husband for the last 2 days. She has still been able to work through this.  She denies nocturnal wheezing    Significant tests/ events  CT chest 2013 stable nodules for more than 2 years  Review of Systems neg for any significant sore throat, dysphagia, itching, sneezing, nasal congestion or excess/ purulent secretions, fever, chills, sweats, unintended wt loss, pleuritic or exertional cp, hempoptysis, orthopnea pnd or change in chronic leg swelling. Also denies presyncope, palpitations, heartburn, abdominal pain, nausea, vomiting, diarrhea or change in bowel or urinary habits, dysuria,hematuria, rash, arthralgias, visual complaints, headache, numbness weakness or ataxia.     Objective:   Physical Exam  Gen. Pleasant, well-nourished, in no distress ENT - no lesions, no post nasal drip Neck: No JVD, no thyromegaly, no carotid bruits Lungs: no use of accessory muscles, no dullness to percussion, clear without rales, occasional right  rhonchi  Cardiovascular: Rhythm regular, heart sounds  normal, no murmurs or gallops, no peripheral edema Musculoskeletal: No deformities, no cyanosis or clubbing        Assessment &  Plan:

## 2015-10-10 NOTE — Assessment & Plan Note (Signed)
We'll treat as acute asthmatic bronchitis -she does not seem to have persistent symptoms anymore, so doubt that she needs chronic inhaled steroids  Complete amoxicillin 500 mg thrice daily for 3 more days Prednisone 10 mg tabs  Take 2 tabs daily with food x 5ds, then 1 tab daily with food x 5ds then STOP  Okay to take Delsym cough syrup as needed twice daily

## 2015-11-05 ENCOUNTER — Ambulatory Visit (INDEPENDENT_AMBULATORY_CARE_PROVIDER_SITE_OTHER): Payer: Medicare Other | Admitting: Pulmonary Disease

## 2015-11-05 ENCOUNTER — Encounter: Payer: Self-pay | Admitting: Pulmonary Disease

## 2015-11-05 VITALS — BP 126/72 | HR 66 | Ht 68.0 in | Wt 201.0 lb

## 2015-11-05 DIAGNOSIS — J302 Other seasonal allergic rhinitis: Secondary | ICD-10-CM

## 2015-11-05 DIAGNOSIS — R05 Cough: Secondary | ICD-10-CM

## 2015-11-05 DIAGNOSIS — R0602 Shortness of breath: Secondary | ICD-10-CM

## 2015-11-05 DIAGNOSIS — R059 Cough, unspecified: Secondary | ICD-10-CM

## 2015-11-05 NOTE — Patient Instructions (Signed)
   Call me if you start to have any new breathing problems before your next appointment.  I will see you back in 3 months or sooner with a breathing test.  TESTS ORDERED: 1. Full PFTs @ next appointment 2. 6MWT on room air @ next appointment

## 2015-11-05 NOTE — Progress Notes (Signed)
Subjective:    Patient ID: Claudia Joseph, female    DOB: 11-25-1945, 70 y.o.   MRN: [id number]  C.C.:  Follow-up for Dyspnea, Cough, & Chronic Seasonal Allergic Rhinitis.  HPI  Patient was seen on 9/20 by Dr. Elsworth Soho with an acute respiratory illness. She did a course of Amoxil & Prednisone. She reports her symptoms started with a sore throat.   Dyspnea:  Patient never scheduled for pulmonary function testing. She reports her breathing is doing "ok". She reports previously did have wheezing with her illness but this too resolved.   Cough: Previously seen by Dr. Joya Gaskins and likely secondary to postnasal drainage. She reports only intermittent coughing now.  Chronic Seasonal Allergic Rhinitis: Patient had a normal IgE and negative RAST panel with minimal elevation in her serum eosinophil count. She denies any recent sinus congestion or pressure.   Review of Systems She denies any reflux or dyspepsia. She reports she is chronically clearing her throat. Does report her voice gets raspy at times. No morning brash water taste. No chest pain or tightness.   Allergies  Allergen Reactions  . Meperidine Hcl     Demerol causes severe HAs    Current Outpatient Prescriptions on File Prior to Visit  Medication Sig Dispense Refill  . amLODipine (NORVASC) 10 MG tablet Take 10 mg by mouth daily.    . cholecalciferol (VITAMIN D) 1000 units tablet Take 1,000 Units by mouth daily.    . hydrochlorothiazide (HYDRODIURIL) 25 MG tablet Take 1 tablet by mouth daily.    . nebivolol (BYSTOLIC) 5 MG tablet Take 5 mg by mouth daily.    . potassium chloride (K-DUR,KLOR-CON) 10 MEQ tablet Take 1 tablet by mouth 2 (two) times daily.     No current facility-administered medications on file prior to visit.     Past Medical History:  Diagnosis Date  . GERD (gastroesophageal reflux disease)   . Head pain, chronic    history of  . Hernia   . Hypertension     Past Surgical History:  Procedure Laterality Date  .  APPENDECTOMY    . CHOLECYSTECTOMY    . COLONOSCOPY    . TONSILLECTOMY    . TUBAL LIGATION      Family History  Problem Relation Age of Onset  . Leukemia Father     deceased  . Pancreatic cancer Maternal Grandmother   . Tongue cancer Brother   . Lung disease Neg Hx     Social History   Social History  . Marital status: Married    Spouse name: N/A  . Number of children: 3  . Years of education: N/A   Occupational History  . Dental Office    Social History Main Topics  . Smoking status: Never Smoker  . Smokeless tobacco: Never Used  . Alcohol use No  . Drug use: No  . Sexual activity: Not Asked   Other Topics Concern  . None   Social History Narrative   Originally from [place]. Previously had mold exposure in her home that underwent professional remediation. 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 No pets currently. No bird or hot tub exposure. 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       Objective:   Physical Exam BP 126/72 (BP Location: Left Arm, Cuff Size: Normal)   Pulse 66   Ht 5\' 8"  (1.727 m)   Wt 201 lb (91.2 kg)   SpO2 96%   BMI 30.56  kg/m  General:  Awake. No distress. Mild central obesity. Integument:  Warm & dry. No bruising on exposed skin. HEENT:  Mild bilateral nasal turbinate swelling. Normal bilateral tympanic membranes with no erythema of external auditory canals. No oral ulcers. Cardiovascular:  Regular rate & rhythm. No edema. Normal S1 & S2. Pulmonary:  No accessory muscle use on room air. We're on auscultation. Good aeration bilaterally. Abdomen: Soft. Normal bowel sounds. Nontender. Musculoskeletal:  Normal bulk and tone. No joint deformity or effusion appreciated.  PFT 04/27/11: FVC 2.98 L (87%) FEV1 2.47 L (94%) FEV1/FVC 0.83 FEF 25-75 2.79 L (124%)  IMAGING CT CHEST W/O 06/08/13 (per radiologist): Two 4 mm nodules left lower lobe unchanged from prior CT imaging. 3 mm left upper lobe nodule  unchanged. No new nodule. No axillary or supraclavicular lymphadenopathy. No mediastinal or hilar adenopathy. Nodule stable going back to 10/08/11.  LABS 07/18/15 CBC: 8.1/14.3/42.3/321 Eosinophils: 0.2 IgE: 18 RAST panel: Negative  04/27/11 IgE: 12.8 Hypersensitivity Pneumonitis Panel:  Aspergillus pullulans 1 band detected    Assessment & Plan:  70 y.o. female with ongoing intermittent cough, allergic rhinitis, and dyspnea on exertion. Holding off on initiating treatment with medications for allergic rhinitis. Her dyspnea is likely multifactorial but I do feel that checking a 6 prolonged test as well as full pulmonary function testing is quite reasonable to better evaluate her lung function. I instructed the patient to notify my office if she had any new breathing problems or questions before next appointment and reviewed her serum workup from last appointment.  1. Dyspnea/Shortness of Breath: Likely multifactorial. Reordering full pulmonary function testing and follow-up appointment as well as 6 minute walk test on room air.  2. Cough: Suspect secondary to allergic rhinitis. Checking full pulmonary function testing at next appointment. 3. Allergic Rhinitis:  Holding off on treatment at this time with season changes.  4. Health Maintenance:  Declines immunizations.  5. Follow-up: Patient to return to clinic in 3 months or sooner if needed.  Sonia Baller Ashok Cordia, M.D. [facility] Pulmonary & Critical Care Pager:  [phone] After 3pm or if no response, call [phone] 4:21 PM 11/05/15

## 2016-01-22 ENCOUNTER — Encounter: Payer: Self-pay | Admitting: Pulmonary Disease

## 2016-02-05 ENCOUNTER — Ambulatory Visit: Payer: Medicare Other | Admitting: Pulmonary Disease

## 2016-02-05 ENCOUNTER — Ambulatory Visit: Payer: Medicare Other

## 2016-03-23 ENCOUNTER — Ambulatory Visit: Payer: Medicare Other

## 2016-03-23 ENCOUNTER — Ambulatory Visit: Payer: Medicare Other | Admitting: Pulmonary Disease

## 2016-05-14 ENCOUNTER — Ambulatory Visit (INDEPENDENT_AMBULATORY_CARE_PROVIDER_SITE_OTHER): Payer: Medicare Other | Admitting: Pulmonary Disease

## 2016-05-14 ENCOUNTER — Ambulatory Visit: Payer: Medicare Other

## 2016-05-14 ENCOUNTER — Encounter: Payer: Self-pay | Admitting: Pulmonary Disease

## 2016-05-14 VITALS — BP 118/72 | HR 79 | Ht 68.0 in | Wt 203.0 lb

## 2016-05-14 DIAGNOSIS — J302 Other seasonal allergic rhinitis: Secondary | ICD-10-CM

## 2016-05-14 DIAGNOSIS — R0602 Shortness of breath: Secondary | ICD-10-CM | POA: Diagnosis not present

## 2016-05-14 DIAGNOSIS — R0600 Dyspnea, unspecified: Secondary | ICD-10-CM

## 2016-05-14 DIAGNOSIS — R918 Other nonspecific abnormal finding of lung field: Secondary | ICD-10-CM

## 2016-05-14 DIAGNOSIS — R0609 Other forms of dyspnea: Secondary | ICD-10-CM

## 2016-05-14 LAB — PULMONARY FUNCTION TEST
DL/VA % pred: 94 %
DL/VA: 4.88 ml/min/mmHg/L
DLCO COR: 21.57 ml/min/mmHg
DLCO cor % pred: 76 %
DLCO unc % pred: 79 %
DLCO unc: 22.61 ml/min/mmHg
FEF 25-75 PRE: 2.88 L/s
FEF 25-75 Post: 3.11 L/sec
FEF2575-%Change-Post: 8 %
FEF2575-%PRED-PRE: 148 %
FEF2575-%Pred-Post: 160 %
FEV1-%Change-Post: 3 %
FEV1-%PRED-PRE: 98 %
FEV1-%Pred-Post: 102 %
FEV1-POST: 2.52 L
FEV1-Pre: 2.43 L
FEV1FVC-%Change-Post: 6 %
FEV1FVC-%Pred-Pre: 110 %
FEV6-%CHANGE-POST: -1 %
FEV6-%PRED-PRE: 92 %
FEV6-%Pred-Post: 90 %
FEV6-POST: 2.83 L
FEV6-Pre: 2.88 L
FEV6FVC-%Change-Post: 0 %
FEV6FVC-%PRED-POST: 105 %
FEV6FVC-%Pred-Pre: 105 %
FVC-%Change-Post: -2 %
FVC-%PRED-PRE: 89 %
FVC-%Pred-Post: 86 %
FVC-POST: 2.83 L
FVC-Pre: 2.92 L
POST FEV6/FVC RATIO: 100 %
PRE FEV1/FVC RATIO: 83 %
Post FEV1/FVC ratio: 89 %
Pre FEV6/FVC Ratio: 100 %
RV % pred: 83 %
RV: 2 L
TLC % PRED: 88 %
TLC: 4.85 L

## 2016-05-14 NOTE — Progress Notes (Signed)
PFT done today. 

## 2016-05-14 NOTE — Patient Instructions (Signed)
   Call me if you have any new breathing problems or questions before your next appointment.  We will call you after the results of your CT scan.  I will see you back in 1 year or sooner if needed.  TESTS ORDERED: 1. CT CHEST W/O

## 2016-05-14 NOTE — Progress Notes (Signed)
Subjective:    Patient ID: Claudia Joseph, female    DOB: Mar 27, 1945, 71 y.o.   MRN: [id number]  C.C.:  Follow-up for Dyspnea on Exertion & Chronic Seasonal Allergic Rhinitis.  HPI  Dyspnea on Exertion:  She reports her dyspnea is unchanged from baseline. No significant worsening. She denies any wheezing. She continues to have dyspnea when climbing 1 flight of steps. She isn't exercising.   Chronic Seasonal Allergic Rhinitis:  She is clearing her throat more. She has noticed more post-nasal drainage. Currently on no medication. Previously was taking Zyrtec.   Review of Systems No chest pain or pressure. No fever or chills. No headache or nausea.   Allergies  Allergen Reactions  . Meperidine Hcl     Demerol causes severe HAs    Current Outpatient Prescriptions on File Prior to Visit  Medication Sig Dispense Refill  . amLODipine (NORVASC) 10 MG tablet Take 10 mg by mouth daily.    . cholecalciferol (VITAMIN D) 1000 units tablet Take 1,000 Units by mouth daily.    . hydrochlorothiazide (HYDRODIURIL) 25 MG tablet Take 1 tablet by mouth daily.    . nebivolol (BYSTOLIC) 5 MG tablet Take 5 mg by mouth daily.    . potassium chloride (K-DUR,KLOR-CON) 10 MEQ tablet Take 1 tablet by mouth 2 (two) times daily.     No current facility-administered medications on file prior to visit.     Past Medical History:  Diagnosis Date  . GERD (gastroesophageal reflux disease)   . Head pain, chronic    history of  . Hernia   . Hypertension     Past Surgical History:  Procedure Laterality Date  . APPENDECTOMY    . CHOLECYSTECTOMY    . COLONOSCOPY    . TONSILLECTOMY    . TUBAL LIGATION      Family History  Problem Relation Age of Onset  . Leukemia Father     deceased  . Pancreatic cancer Maternal Grandmother   . Tongue cancer Brother   . Lung disease Neg Hx     Social History   Social History  . Marital status: Married    Spouse name: N/A  . Number of children: 3  . Years of  education: N/A   Occupational History  . Dental Office    Social History Main Topics  . Smoking status: Never Smoker  . Smokeless tobacco: Never Used  . Alcohol use No  . Drug use: No  . Sexual activity: Not Asked   Other Topics Concern  . None   Social History Narrative   Originally from [place]. Previously had mold exposure in her home that underwent professional remediation. 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 No pets currently. No bird or hot tub exposure. 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       Objective:   Physical Exam BP 118/72 (BP Location: Left Arm, Patient Position: Sitting, Cuff Size: Normal)   Pulse 79   Ht 5\' 8"  (1.727 m)   Wt 203 lb (92.1 kg)   SpO2 95%   BMI 30.87 kg/m   Gen.: Mild central obesity. No distress. Comfortable. HEENT: Continuing mild bilateral nasal turbinate swelling. No oral ulcers. No scleral icterus. Pulmonary: Clear bilaterally to auscultation. No accessory muscle use on room air. Cardiovascular: Regular rate. Regular rhythm. No edema. Abdomen: Soft. Nondistended. Normal bowel sounds.  PFT 05/14/16: FVC 2.92 L (89%) FEV1 2.43 L (98%) FEV1/FVC 0.83 FEF 25-75 2.88 L (140%)  negative bronchodilator response TLC 4.85 L (88%) RV 83% ERV 138% DLCO corrected 76% 04/27/11: FVC 2.98 L (87%) FEV1 2.47 L (94%) FEV1/FVC 0.83 FEF 25-75 2.79 L (124%)  IMAGING CT CHEST W/O 06/08/13 (per radiologist): Two 4 mm nodules left lower lobe unchanged from prior CT imaging. 3 mm left upper lobe nodule unchanged. No new nodule. No axillary or supraclavicular lymphadenopathy. No mediastinal or hilar adenopathy. Nodule stable going back to 10/08/11.  LABS 07/18/15 CBC: 8.1/14.3/42.3/321 Eosinophils: 0.2 IgE: 18 RAST panel: Negative  04/27/11 IgE: 12.8 Hypersensitivity Pneumonitis Panel:  Aspergillus pullulans 1 band detected    Assessment & Plan:  71 y.o. female with dyspnea on exertion & chronic seasonal allergic  rhinitis. Patient has a known history of multiple lung nodules. Patient is concerned that these could have represented cancer given her family history which is extensive and wishes to repeat imaging to ensure no significant worsening. I feel this is reasonable. We did discuss the fact that her dyspnea on exertion is likely multifactorial and due to her weight as well as deconditioning. We also discussed her pulmonary function testing which was normal without any evidence of airway obstruction, restriction, or abnormal problem monoxide diffusion capacity. As such, I do not feel a 6 minute walk test is necessary. She would benefit from medication to treat her allergic rhinitis and we discussed potential options. I instructed the patient to contact my office if she had any new breathing problems or questions before next appointment.   1. Dyspnea on exertion: recommended weight loss and exercise regimen to improve endurance.  2. Chronic seasonal allergic rhinitis: recommended trying Allegra or Claritin for symptomatic relief.  3. Multiple lung nodules: checking CT chest without contrast.  4. Health maintenance: Previously declined immunizations. 5. Follow-up: Return to clinic in  one year sooner if needed.   Sonia Baller Ashok Cordia, M.D. [facility] Pager:  [phone] After 3pm or if no response, call [phone] 1:39 PM 05/14/16

## 2016-05-18 ENCOUNTER — Other Ambulatory Visit: Payer: Medicare Other

## 2016-05-22 ENCOUNTER — Other Ambulatory Visit: Payer: Medicare Other

## 2016-05-28 ENCOUNTER — Other Ambulatory Visit: Payer: Medicare Other

## 2017-02-11 ENCOUNTER — Other Ambulatory Visit (INDEPENDENT_AMBULATORY_CARE_PROVIDER_SITE_OTHER): Payer: Medicare Other

## 2017-02-11 ENCOUNTER — Encounter: Payer: Self-pay | Admitting: Adult Health

## 2017-02-11 ENCOUNTER — Ambulatory Visit: Payer: Medicare Other | Admitting: Adult Health

## 2017-02-11 DIAGNOSIS — J029 Acute pharyngitis, unspecified: Secondary | ICD-10-CM | POA: Insufficient documentation

## 2017-02-11 DIAGNOSIS — J101 Influenza due to other identified influenza virus with other respiratory manifestations: Secondary | ICD-10-CM | POA: Insufficient documentation

## 2017-02-11 LAB — BETA STREP SCREEN: STREPTOCOCCUS, GROUP A SCREEN (DIRECT): NEGATIVE

## 2017-02-11 MED ORDER — OSELTAMIVIR PHOSPHATE 75 MG PO CAPS: 75.0000 mg | ORAL_CAPSULE | Freq: Two times a day (BID) | ORAL | 0 refills | Status: AC

## 2017-02-11 NOTE — Addendum Note (Signed)
Addended by: Parke Poisson E on: 02/11/2017 02:53 PM   Modules accepted: Orders

## 2017-02-11 NOTE — Patient Instructions (Signed)
Tamiflu Twice daily  For 5 days  Mucinex Twice daily  As needed  Cough/congestion  Delsym Twice daily  As needed  Cough .  Fluids and rest  Alternate Tylenol and Advil as needed.  Check strep test  Follow up with with Dr. Vaughan Browner in 4-6 months and As needed   Please contact office for sooner follow up if symptoms do not improve or worsen or seek emergency care

## 2017-02-11 NOTE — Assessment & Plan Note (Signed)
Pharygitis with influenza  Check strep test   Supportive care

## 2017-02-11 NOTE — Addendum Note (Signed)
Addended by: Elton Sin on: 02/11/2017 02:51 PM   Modules accepted: Orders

## 2017-02-11 NOTE — Progress Notes (Signed)
@Patient  ID: Claudia Joseph, female    DOB: Sep 23, 1945, 72 y.o.   MRN: [id number]  Chief Complaint  Patient presents with  . Acute Visit    Bronchitis     Referring provider: No ref. provider found  HPI: 72 year old female never smoker followed for allergic rhinitis and dyspnea  TEST  05/14/16: FVC 2.92 L (89%) FEV1 2.43 L (98%) FEV1/FVC 0.83 FEF 25-75 2.88 L (140%)  negative bronchodilator response TLC 4.85 L (88%) RV 83% ERV 138% DLCO corrected 76% 04/27/11: FVC 2.98 L (87%) FEV1 2.47 L (94%) FEV1/FVC 0.83 FEF 25-75 2.79 L (124%)  IMAGING CT CHEST W/O 06/08/13 (per radiologist): Two 4 mm nodules left lower lobe unchanged from prior CT imaging. 3 mm left upper lobe nodule unchanged. No new nodule. No axillary or supraclavicular lymphadenopathy. No mediastinal or hilar adenopathy. Nodule stable going back to 10/08/11  LABS 07/18/15 CBC: 8.1/14.3/42.3/321 Eosinophils: 0.2 IgE: 18 RAST panel: Negative  04/27/11 IgE: 12.8 Hypersensitivity Pneumonitis Panel:  Aspergillus pullulans 1 band detected  02/11/2017 Acute OV : Bronchitis  Patient presents for an acute office visit.  She complains of sudden onset of one day of cough ,  body aches , hoarseness , sore throat and fatigue.  Flu swab is positive . She has been taking advil . Did not check for fever.  Appetite is okay w/ no nv/d.     Allergies  Allergen Reactions  . Meperidine Hcl     Demerol causes severe HAs    There is no immunization history for the selected administration types on file for this patient.  Past Medical History:  Diagnosis Date  . GERD (gastroesophageal reflux disease)   . Head pain, chronic    history of  . Hernia   . Hypertension     Tobacco History: Social History   Tobacco Use  Smoking Status Never Smoker  Smokeless Tobacco Never Used   Counseling given: Not Answered   Outpatient Encounter Medications as of 02/11/2017  Medication Sig  . amLODipine (NORVASC) 10 MG tablet Take 10 mg  by mouth daily.  . cholecalciferol (VITAMIN D) 1000 units tablet Take 1,000 Units by mouth daily.  . nebivolol (BYSTOLIC) 5 MG tablet Take 5 mg by mouth daily.  . potassium chloride (K-DUR,KLOR-CON) 10 MEQ tablet Take 1 tablet by mouth 2 (two) times daily.  . TURMERIC PO Take by mouth.  . hydrochlorothiazide (HYDRODIURIL) 25 MG tablet Take 1 tablet by mouth daily.  Marland Kitchen oseltamivir (TAMIFLU) 75 MG capsule Take 1 capsule (75 mg total) by mouth 2 (two) times daily for 5 days.   No facility-administered encounter medications on file as of 02/11/2017.      Review of Systems  Constitutional:   No  weight loss, night sweats,  Fevers,  +chills, fatigue, or  Lassitude. +body aches   HEENT:   No headaches,  Difficulty swallowing,  Tooth/dental problems, or   +Sore throat,                No sneezing, itching, ear ache,  +nasal congestion, post nasal drip,   CV:  No chest pain,  Orthopnea, PND, swelling in lower extremities, anasarca, dizziness, palpitations, syncope.   GI  No heartburn, indigestion, abdominal pain, nausea, vomiting, diarrhea, change in bowel habits, loss of appetite, bloody stools.   Resp:  No chest wall deformity  Skin: no rash or lesions.  GU: no dysuria, change in color of urine, no urgency or frequency.  No flank pain, no hematuria  MS:  No joint pain or swelling.  No decreased range of motion.  No back pain.    Physical Exam  BP 126/82 (BP Location: Left Arm, Cuff Size: Normal)   Pulse 98   Ht 5' 7.5" (1.715 m)   Wt 200 lb 3.2 oz (90.8 kg)   SpO2 93%   BMI 30.89 kg/m   GEN: A/Ox3; pleasant , NAD, well nourished    HEENT:  /AT,  EACs-clear, TMs-wnl, NOSE-clear, THROAT-w/ erythema , no lesions, no postnasal drip or exudate noted.   NECK:  Supple w/ fair ROM; no JVD; normal carotid impulses w/o bruits; no thyromegaly or nodules palpated; no lymphadenopathy.    RESP  Clear  P & A; w/o, wheezes/ rales/ or rhonchi. no accessory muscle use, no dullness to  percussion  CARD:  RRR, no m/r/g, no peripheral edema, pulses intact, no cyanosis or clubbing.  GI:   Soft & nt; nml bowel sounds; no organomegaly or masses detected.   Musco: Warm bil, no deformities or joint swelling noted.   Neuro: alert, no focal deficits noted.    Skin: Warm, no lesions or rashes    Lab Results:  CBC  ProBNP No results found for: PROBNP  Imaging: No results found.   Assessment & Plan:   Influenza A Influenza A- pt is w/in the first 24hr of sx  Will treat with Tamiflu . Supportive care   Plan  LABS 07/18/15 CBC: 8.1/14.3/42.3/321 Eosinophils: 0.2 IgE: 18 RAST panel: Negative  04/27/11 IgE: 12.8 Hypersensitivity Pneumonitis Panel:  Aspergillus pullulans 1 band detected  Sore throat Pharygitis with influenza  Check strep test   Supportive care      Rexene Edison, NP 02/11/2017

## 2017-02-11 NOTE — Assessment & Plan Note (Signed)
Influenza A- pt is w/in the first 24hr of sx  Will treat with Tamiflu . Supportive care   Plan  LABS 07/18/15 CBC: 8.1/14.3/42.3/321 Eosinophils: 0.2 IgE: 18 RAST panel: Negative  04/27/11 IgE: 12.8 Hypersensitivity Pneumonitis Panel:  Aspergillus pullulans 1 band detected

## 2017-02-20 ENCOUNTER — Emergency Department
Admission: EM | Admit: 2017-02-20 | Discharge: 2017-02-20 | Disposition: A | Discharge status: Home / Self Care | Payer: Medicare Other | Source: Home / Self Care | Attending: Family Medicine | Admitting: Family Medicine

## 2017-02-20 ENCOUNTER — Encounter: Payer: Self-pay | Admitting: Emergency Medicine

## 2017-02-20 ENCOUNTER — Emergency Department (INDEPENDENT_AMBULATORY_CARE_PROVIDER_SITE_OTHER): Payer: Medicare Other

## 2017-02-20 DIAGNOSIS — R0602 Shortness of breath: Secondary | ICD-10-CM | POA: Diagnosis not present

## 2017-02-20 DIAGNOSIS — R05 Cough: Secondary | ICD-10-CM

## 2017-02-20 DIAGNOSIS — M940 Chondrocostal junction syndrome [Tietze]: Secondary | ICD-10-CM | POA: Diagnosis not present

## 2017-02-20 DIAGNOSIS — R053 Chronic cough: Secondary | ICD-10-CM

## 2017-02-20 MED ORDER — BENZONATATE 200 MG PO CAPS: ORAL_CAPSULE | ORAL | 0 refills | Status: DC

## 2017-02-20 MED ORDER — METHYLPREDNISOLONE SODIUM SUCC 125 MG IJ SOLR
80.0000 mg | Freq: Once | INTRAMUSCULAR | Status: AC
  Administered 2017-02-20: 80 mg via INTRAMUSCULAR

## 2017-02-20 MED ORDER — AZITHROMYCIN 250 MG PO TABS: ORAL_TABLET | ORAL | 0 refills | Status: DC

## 2017-02-20 MED ORDER — PREDNISONE 20 MG PO TABS: ORAL_TABLET | ORAL | 0 refills | Status: DC

## 2017-02-20 NOTE — ED Provider Notes (Signed)
[facility]    CSN: [id number] Arrival date & time: 02/20/17  1639     History   Chief Complaint Chief Complaint  Patient presents with  . Cough    HPI Claudia Joseph is a 72 y.o. female.   About 10 days ago patient developed a flu-like illness.  She tested positive for influenza A and was treated for five days with Tamiflu. She felt better until two days ago when she developed increased cough and tightness in her anterior chest.  No fevers, chills, and sweats, and no shortness of breath.   The history is provided by the patient.    Past Medical History:  Diagnosis Date  . GERD (gastroesophageal reflux disease)   . Head pain, chronic    history of  . Hernia   . Hypertension     Patient Active Problem List   Diagnosis Date Noted  . Influenza A 02/11/2017  . Sore throat 02/11/2017  . Primary osteoarthritis of right knee 08/26/2015  . Chronic seasonal allergic rhinitis 07/16/2015  . Dyspnea 07/16/2015  . Right foot pain 06/24/2015  . Obesity (BMI 30-39.9) 10/19/2013  . Multiple benign melanocytic nevi 08/22/2013  . Cough 06/08/2013  . Pulmonary nodules 04/27/2011  . Hypertension     Past Surgical History:  Procedure Laterality Date  . APPENDECTOMY    . CHOLECYSTECTOMY    . COLONOSCOPY    . TONSILLECTOMY    . TUBAL LIGATION      OB History    No data available       Home Medications    Prior to Admission medications   Medication Sig Start Date End Date Taking? Authorizing Provider  amLODipine (NORVASC) 10 MG tablet Take 10 mg by mouth daily.    [provider]  azithromycin (ZITHROMAX Z-PAK) 250 MG tablet Take 2 tabs today; then begin one tab once daily for 4 more days. 02/20/17   Kandra Nicolas, MD  benzonatate (TESSALON) 200 MG capsule Take one cap by mouth at bedtime as needed for cough.  May repeat in 4 to 6 hours 02/20/17   Kandra Nicolas, MD  cholecalciferol (VITAMIN D) 1000 units tablet Take 1,000 Units by mouth daily.     [provider]  nebivolol (BYSTOLIC) 5 MG tablet Take 5 mg by mouth daily.    [provider]  potassium chloride (K-DUR,KLOR-CON) 10 MEQ tablet Take 1 tablet by mouth 2 (two) times daily.    [provider]  predniSONE (DELTASONE) 20 MG tablet Take one tab by mouth twice daily for 4 days, then one daily for 3 days. Take with food. 02/20/17   Kandra Nicolas, MD  TURMERIC PO Take by mouth.    [provider]    Family History Family History  Problem Relation Age of Onset  . Leukemia Father        deceased  . Pancreatic cancer Maternal Grandmother   . Tongue cancer Brother   . Lung disease Neg Hx     Social History Social History   Tobacco Use  . Smoking status: Never Smoker  . Smokeless tobacco: Never Used  Substance Use Topics  . Alcohol use: No  . Drug use: No     Allergies   Meperidine hcl   Review of Systems Review of Systems No sore throat + cough No pleuritic pain, but has anterior chest tightness No wheezing ? nasal congestion No post-nasal drainage No sinus pain/pressure No itchy/red eyes No earache No  hemoptysis No SOB No fever/chills No nausea No vomiting No abdominal pain No diarrhea No urinary symptoms No skin rash + fatigue No myalgias No headache Used OTC meds without relief   Physical Exam Triage Vital Signs ED Triage Vitals [02/20/17 1706]  Enc Vitals Group     BP (!) 145/83     Pulse Rate 90     Resp      Temp 98.3 F (36.8 C)     Temp Source Oral     SpO2 97 %     Weight 200 lb 8 oz (90.9 kg)     Height 5' 7.5" (1.715 m)     Head Circumference      Peak Flow      Pain Score 0     Pain Loc      Pain Edu?      Excl. in [place]?    No data found.  Updated Vital Signs BP (!) 145/83 (BP Location: Right Arm)   Pulse 90   Temp 98.3 F (36.8 C) (Oral)   Ht 5' 7.5" (1.715 m)   Wt 200 lb 8 oz (90.9 kg)   SpO2 97%   BMI 30.94 kg/m   Visual Acuity Right Eye Distance:   Left Eye Distance:    Bilateral Distance:    Right Eye Near:   Left Eye Near:    Bilateral Near:     Physical Exam  Constitutional: She appears well-developed and well-nourished. No distress.  HENT:  Head: Normocephalic.  Right Ear: Tympanic membrane, external ear and ear canal normal.  Left Ear: Tympanic membrane, external ear and ear canal normal.  Nose: Nose normal.  Mouth/Throat: Oropharynx is clear and moist.  Eyes: Conjunctivae are normal. Pupils are equal, round, and reactive to light.  Neck: Neck supple.  Cardiovascular: Normal heart sounds.  Pulmonary/Chest: Breath sounds normal.  Chest:  Distinct tenderness to palpation over the mid-sternum as noted on diagram.     Abdominal: There is no tenderness.  Musculoskeletal: She exhibits no edema or tenderness.  Lymphadenopathy:    She has cervical adenopathy.  Neurological: She is alert.  Skin: Skin is warm and dry.  Nursing note and vitals reviewed.    UC Treatments / Results  Labs (all labs ordered are listed, but only abnormal results are displayed) Labs Reviewed - No data to display  EKG  EKG Interpretation None       Radiology Dg Chest 2 View  Result Date: 02/20/2017 CLINICAL DATA:  Pt states she has had a productive cough x 2 weeks. Dx with the flu 10 days ago. No relief from medications. States she is now having increased sob with chest tightness since yesterday. Hx of htn. EXAM: CHEST  2 VIEW COMPARISON:  12/11/2012 FINDINGS: The heart size and mediastinal contours are within normal limits. Both lungs are clear. The visualized skeletal structures are unremarkable. IMPRESSION: No active cardiopulmonary disease. Electronically Signed   By: Nolon Nations M.D.   On: 02/20/2017 17:54    Procedures Procedures (including critical care time)  Medications Ordered in UC Medications  methylPREDNISolone sodium succinate (SOLU-MEDROL) 125 mg/2 mL injection 80 mg (not administered)     Initial Impression / Assessment and Plan /  UC Course  I have reviewed the triage vital signs and the nursing notes.  Pertinent labs & imaging results that were available during my care of the patient were reviewed by me and considered in my medical decision making (see chart for details).  Administered Solumedrol 80mg  IM. Begin Z-pak for atypical coverage. Prescription written for Benzonatate [facility]) to take at bedtime for night-time cough.  Begin prednisone burst/taper. Sunday 02/21/17. Take plain guaifenesin (1200mg extended release tabs such as Mucinex) twice daily, with plenty of water, for cough and congestion.  Get adequate rest.   May use Afrin nasal spray (or generic oxymetazoline) each morning for about 5 days and then discontinue.  Also recommend using saline nasal spray several times daily and saline nasal irrigation (AYR is a common brand).  Use Flonase nasal spray each morning after using Afrin nasal spray and saline nasal irrigation. Try warm salt water gargles for sore throat.  May take Delsym Cough Suppressant at bedtime for nighttime cough.  Stop all antihistamines for now, and other non-prescription cough/cold preparations. Apply ice pack to sternum for about 20 minutes, 3 to 4 times daily  Continue until pain decreases.  Followup with Family Doctor if not improved in one week.     Final Clinical Impressions(s) / UC Diagnoses   Final diagnoses:  Persistent cough  Costochondritis    ED Discharge Orders        Ordered    predniSONE (DELTASONE) 20 MG tablet     02/20/17 1810    azithromycin (ZITHROMAX Z-PAK) 250 MG tablet     02/20/17 1810    benzonatate (TESSALON) 200 MG capsule     02 /02/19 1810           Kandra Nicolas, MD 02/23/17 1252

## 2017-02-20 NOTE — ED Triage Notes (Signed)
Patient complaining of productive cough x 1 week, tightness in chest, difficulty breathing, just got over the flu and took Tamiflu.

## 2017-02-20 NOTE — Discharge Instructions (Signed)
Begin prednisone Sunday 02/21/17. Take plain guaifenesin (1200mg  extended release tabs such as Mucinex) twice daily, with plenty of water, for cough and congestion.  Get adequate rest.   May use Afrin nasal spray (or generic oxymetazoline) each morning for about 5 days and then discontinue.  Also recommend using saline nasal spray several times daily and saline nasal irrigation (AYR is a common brand).  Use Flonase nasal spray each morning after using Afrin nasal spray and saline nasal irrigation. Try warm salt water gargles for sore throat.  May take Delsym Cough Suppressant at bedtime for nighttime cough.  Stop all antihistamines for now, and other non-prescription cough/cold preparations. Apply ice pack to sternum for about 20 minutes, 3 to 4 times daily  Continue until pain decreases.

## 2017-10-12 ENCOUNTER — Ambulatory Visit (INDEPENDENT_AMBULATORY_CARE_PROVIDER_SITE_OTHER): Payer: Medicare Other | Admitting: Pulmonary Disease

## 2017-10-12 ENCOUNTER — Encounter: Payer: Self-pay | Admitting: Pulmonary Disease

## 2017-10-12 VITALS — BP 116/70 | HR 64 | Ht 67.5 in | Wt 194.0 lb

## 2017-10-12 DIAGNOSIS — J679 Hypersensitivity pneumonitis due to unspecified organic dust: Secondary | ICD-10-CM

## 2017-10-12 DIAGNOSIS — R918 Other nonspecific abnormal finding of lung field: Secondary | ICD-10-CM | POA: Diagnosis not present

## 2017-10-12 DIAGNOSIS — J3089 Other allergic rhinitis: Secondary | ICD-10-CM | POA: Diagnosis not present

## 2017-10-12 NOTE — Progress Notes (Signed)
Synopsis: Former patient of Dr. Ashok Cordia with dyspnea, pulmonary nodules and allergic rhinitis; in 2013 she had serious mold damage to her home requiring remediation.  Per sensitivity pneumonitis precipitins blood testing was positive for an aspergillus species.  She had to move out of her house for about 8 weeks while her home was remediated.  Subjective:   PATIENT ID: Claudia Joseph GENDER: female DOB: October 27, 1945, MRN: [id number]   HPI  Chief Complaint  Patient presents with  . Follow-up    Former [place] patient. Patient has a history of being exposed to mold. Patient was told by [facility] last year her lungs have cleared up. She wants to make sure this is still the case.     [place] was previously seen by Dr. Joya Gaskins and Dr. Ashok Cordia.   > she says that she was exposed to some mold 5 years ago in her house in her basement: they had a finished basement with mold behind the walls > she says that Dr. Joya Gaskins checked a CT scan of her chest that showed nodules.    She remains very concerned about the nodules.   She says that she had mold I her office at work again.  They said that the office was smelling musty and they noticed some moisture in the office.  She says that the humidity in her system has created a lot of moisture in the office which has lead to a lot of mold.  They are looking to remediate the problem.  She says that she is coughing more and this is similar to her prior episodes of bronchitis and mold exposure.  In general her breathing has been OK, she just has the cough.  She has noticed more post nasal drip and is clearning her throat more.  She is currently not taking anything for it.    Past Medical History:  Diagnosis Date  . GERD (gastroesophageal reflux disease)   . Head pain, chronic    history of  . Hernia   . Hypertension      Family History  Problem Relation Age of Onset  . Leukemia Father        deceased  . Pancreatic cancer Maternal Grandmother   . Tongue cancer Brother   .  Lung disease Neg Hx      Social History   Socioeconomic History  . Marital status: Married    Spouse name: Not on file  . Number of children: 3  . Years of education: Not on file  . Highest education level: Not on file  Occupational History  . Occupation: Soil scientist  Social Needs  . Financial resource strain: Not on file  . Food insecurity:    Worry: Not on file    Inability: Not on file  . Transportation needs:    Medical: Not on file    Non-medical: Not on file  Tobacco Use  . Smoking status: Never Smoker  . Smokeless tobacco: Never Used  Substance and Sexual Activity  . Alcohol use: No  . Drug use: No  . Sexual activity: Not on file  Lifestyle  . Physical activity:    Days per week: Not on file    Minutes per session: Not on file  . Stress: Not on file  Relationships  . Social connections:    Talks on phone: Not on file    Gets together: Not on file    Attends religious service: Not on file    Active member of club  or organization: Not on file    Attends meetings of clubs or organizations: Not on file    Relationship status: Not on file  . Intimate partner violence:    Fear of current or ex partner: Not on file    Emotionally abused: Not on file    Physically abused: Not on file    Forced sexual activity: Not on file  Other Topics Concern  . Not on file  Social History Narrative   Originally from [place]. Previously had mold exposure in her home that underwent professional remediation. 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 No pets currently. No bird or hot tub exposure. 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      Allergies  Allergen Reactions  . Meperidine Hcl     Demerol causes severe HAs  . Morphine And Related     Causes bad headaches per patient     Outpatient Medications Prior to Visit  Medication Sig Dispense Refill  . amLODipine (NORVASC) 10 MG tablet Take 10 mg by mouth daily.    . benzonatate  (TESSALON) 200 MG capsule Take one cap by mouth at bedtime as needed for cough.  May repeat in 4 to 6 hours 15 capsule 0  . cholecalciferol (VITAMIN D) 1000 units tablet Take 1,000 Units by mouth daily.    . metoprolol succinate (TOPROL-XL) 25 MG 24 hr tablet Take 25 mg by mouth daily.  0  . potassium chloride (K-DUR,KLOR-CON) 10 MEQ tablet Take 1 tablet by mouth 2 (two) times daily.    . Potassium Chloride ER 20 MEQ TBCR Take by mouth.    . RABEprazole (ACIPHEX) 20 MG tablet Take 20 mg by mouth daily.  3  . TURMERIC PO Take by mouth.    Marland Kitchen azithromycin (ZITHROMAX Z-PAK) 250 MG tablet Take 2 tabs today; then begin one tab once daily for 4 more days. 6 tablet 0  . nebivolol (BYSTOLIC) 5 MG tablet Take 5 mg by mouth daily.    . predniSONE (DELTASONE) 20 MG tablet Take one tab by mouth twice daily for 4 days, then one daily for 3 days. Take with food. 11 tablet 0   No facility-administered medications prior to visit.     Review of Systems  Constitutional: Negative for chills, fever and malaise/fatigue.  HENT: Positive for congestion. Negative for nosebleeds and sinus pain.   Respiratory: Positive for cough. Negative for sputum production and shortness of breath.   Cardiovascular: Negative for palpitations, claudication and leg swelling.  Gastrointestinal: Melena: .dbmfu.      Objective:  Physical Exam   Vitals:   10/12/17 1548  BP: 116/70  Pulse: 64  SpO2: 96%  Weight: 194 lb (88 kg)  Height: 5' 7.5" (1.715 m)    Gen: well appearing HENT: OP clear, TM's clear, neck supple PULM: CTA B, normal percussion CV: RRR, no mgr, trace edema GI: BS+, soft, nontender Derm: no cyanosis or rash Psyche: normal mood and affect   CBC    Component Value Date/Time   WBC 8.1 07/18/2015 1019   RBC 4.69 07/18/2015 1019   HGB 14.3 07/18/2015 1019   HCT 42.3 07/18/2015 1019   PLT 321.0 07/18/2015 1019   MCV 90.2 07/18/2015 1019   MCHC 33.9 07/18/2015 1019   RDW 12.8 07/18/2015 1019    LYMPHSABS 3.1 07/18/2015 1019   MONOABS 0.5 07/18/2015 1019   EOSABS 0.2 07/18/2015 1019   BASOSABS 0.1 07/18/2015 1019  Chest imaging: 2015 CT chest > 63mm nodules in the LLL and 43mm LUL nodule unchanged compared to 2013, consistent with benign etiology 02/2017 CXR images reviewed: normal pulmonary parenchyma, no infiltrate, no mass  PFT: 10/12/2017 ratio normal FVC 2.83L (86% pred), TLC 4.85L (88% pred), DLCO 22.61 ml (79% pred)  Labs: 07/18/15 CBC: 8.1/14.3/42.3/321 Eosinophils: 0.2 IgE: 18 RAST panel: Negative  04/27/11 IgE: 12.8 Hypersensitivity Pneumonitis Panel: Aspergillus pullulans 1 band detected  Path:  Echo:  Heart Catheterization:       Assessment & Plan:   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  Specifically precipitins blood testing was positive after serious mold exposure and she had to leave her home for about 8 weeks.  She says that in the last 2 weeks there has been mold discovered in her doctor's office again and she starting to have worsening cough which is consistent with her prior episode of hypersensitivity pneumonitis.  On physical exam her lungs are clear.  By my personal review of multiple CT scans of the chest I do not see any convincing evidence of hypersensitivity pneumonitis, just small benign nodules.  So I think the most likely etiology here is allergic rhinitis contributing to the cough.  She is quite concerned about the nodules and prior mold exposure and wants to have a CT scan of her chest.  Plan: Cough, prior history of hypersensitivity pneumonitis: We will check a high-resolution CT scan of  your chest to make sure there is no evidence of disease recurrence I recommend you treat the cough by treating allergic rhinitis: Use Neil Med rinses with distilled water at least twice per day using the instructions on the package. 1/2 hour after using the [facility] Med rinse, use Nasacort two puffs in each nostril once per day.  Remember that the Nasacort can take 1-2 weeks to work after regular use. Use generic zyrtec (cetirizine) every day.  If this doesn't help, then stop taking it and use chlorpheniramine-phenylephrine combination tablets.  We will call you with the results of the CT scan, if there are new abnormalities we will see you back in a few weeks.  Otherwise, I will plan on seeing you back in 6 months  Greater than 50% of this 27-minute visit spent face-to-face    Current Outpatient Medications:  .  amLODipine (NORVASC) 10 MG tablet, Take 10 mg by mouth daily., Disp: , Rfl:  .  benzonatate (TESSALON) 200 MG capsule, Take one cap by mouth at bedtime as needed for cough.  May repeat in 4 to 6 hours, Disp: 15 capsule, Rfl: 0 .  cholecalciferol (VITAMIN D) 1000 units tablet, Take 1,000 Units by mouth daily., Disp: , Rfl:  .  metoprolol succinate (TOPROL-XL) 25 MG 24 hr tablet, Take 25 mg by mouth daily., Disp: , Rfl: 0 .  potassium chloride (K-DUR,KLOR-CON) 10 MEQ tablet, Take 1 tablet by mouth 2 (two) times daily., Disp: , Rfl:  .  Potassium Chloride ER 20 MEQ TBCR, Take by mouth., Disp: , Rfl:  .  RABEprazole (ACIPHEX) 20 MG tablet, Take 20 mg by mouth daily., Disp: , Rfl: 3 .  TURMERIC PO, Take by mouth., Disp: , Rfl:

## 2017-10-12 NOTE — Patient Instructions (Signed)
Cough, prior history of hypersensitivity pneumonitis: We will check a high-resolution CT scan of your chest to make sure there is no evidence of disease recurrence I recommend you treat the cough by treating allergic rhinitis: Use Neil Med rinses with distilled water at least twice per day using the instructions on the package. 1/2 hour after using the [facility] Med rinse, use Nasacort two puffs in each nostril once per day.  Remember that the Nasacort can take 1-2 weeks to work after regular use. Use generic zyrtec (cetirizine) every day.  If this doesn't help, then stop taking it and use chlorpheniramine-phenylephrine combination tablets.  We will call you with the results of the CT scan, if there are new abnormalities we will see you back in a few weeks.  Otherwise, I will plan on seeing you back in 6 months

## 2017-10-27 ENCOUNTER — Ambulatory Visit (HOSPITAL_BASED_OUTPATIENT_CLINIC_OR_DEPARTMENT_OTHER)
Admission: RE | Admit: 2017-10-27 | Discharge: 2017-10-27 | Disposition: A | Discharge status: Home / Self Care | Payer: Medicare Other | Source: Ambulatory Visit | Attending: Pulmonary Disease | Admitting: Pulmonary Disease

## 2017-10-27 DIAGNOSIS — K760 Fatty (change of) liver, not elsewhere classified: Secondary | ICD-10-CM | POA: Diagnosis not present

## 2017-10-27 DIAGNOSIS — I2510 Atherosclerotic heart disease of native coronary artery without angina pectoris: Secondary | ICD-10-CM | POA: Insufficient documentation

## 2017-10-27 DIAGNOSIS — J679 Hypersensitivity pneumonitis due to unspecified organic dust: Secondary | ICD-10-CM | POA: Diagnosis present

## 2017-10-27 DIAGNOSIS — I700 Atherosclerosis of aorta: Secondary | ICD-10-CM | POA: Insufficient documentation

## 2017-10-27 DIAGNOSIS — R918 Other nonspecific abnormal finding of lung field: Secondary | ICD-10-CM | POA: Diagnosis not present

## 2017-10-27 DIAGNOSIS — J479 Bronchiectasis, uncomplicated: Secondary | ICD-10-CM | POA: Insufficient documentation

## 2017-10-28 ENCOUNTER — Telehealth: Payer: Self-pay | Admitting: Pulmonary Disease

## 2017-10-28 NOTE — Telephone Encounter (Signed)
Notes recorded by Juanito Doom, MD on 10/27/2017 at 1:29 PM EDT BJ, Please let the patient know this showed bronchiectasis, will discuss more next visit. Thanks, B ------------------------------------------------ Spoke with pt. She is aware of results. Pt review her CT report on MyChart, she is very concerned about the mention of something being wrong with her heart.  BQ - please advise. Thanks.

## 2017-10-29 NOTE — Telephone Encounter (Signed)
Coronary and aortic calcification are very common findings on CT scans.  She should discuss more with her PCP about whether or not she should see a cardiologist

## 2017-10-29 NOTE — Telephone Encounter (Signed)
Called and spoke to patient, made aware of recommendations. Voiced understanding. Nothing further is needed at this time.

## 2017-10-29 NOTE — Telephone Encounter (Signed)
Patient returned Claudia Joseph's call, CB is [phone].

## 2018-02-01 ENCOUNTER — Telehealth: Payer: Self-pay | Admitting: Pulmonary Disease

## 2018-02-01 NOTE — Telephone Encounter (Signed)
Called and spoke with Patient.  TP recommendations given.  OV scheduled with Geraldo Pitter, NP, 02/02/18, at 0900.  Nothing further at this time.

## 2018-02-01 NOTE — Telephone Encounter (Signed)
Called and spoke with Patient.  She is requesting Prednisone to be sent [facility].  She has had a productive cough for 3 days, with thin, clear mucus.  She declined OV, but is concerned that she will get worse and have bronchitis.    Tammy P, NP, please advise

## 2018-02-01 NOTE — Telephone Encounter (Signed)
Will need ov please .  MUcinex DM Twice daily  As needed   Please contact office for sooner follow up if symptoms do not improve or worsen or seek emergency care

## 2018-02-02 ENCOUNTER — Ambulatory Visit: Payer: Medicare Other | Admitting: Primary Care

## 2018-02-02 ENCOUNTER — Encounter: Payer: Self-pay | Admitting: Primary Care

## 2018-02-02 VITALS — BP 120/74 | HR 94 | Temp 98.0°F | Ht 67.5 in | Wt 198.8 lb

## 2018-02-02 DIAGNOSIS — J471 Bronchiectasis with (acute) exacerbation: Secondary | ICD-10-CM

## 2018-02-02 DIAGNOSIS — J479 Bronchiectasis, uncomplicated: Secondary | ICD-10-CM | POA: Insufficient documentation

## 2018-02-02 MED ORDER — ALBUTEROL SULFATE HFA 108 (90 BASE) MCG/ACT IN AERS: 2.0000 | INHALATION_SPRAY | Freq: Four times a day (QID) | RESPIRATORY_TRACT | 6 refills | Status: DC | PRN

## 2018-02-02 MED ORDER — HYDROCODONE-HOMATROPINE 5-1.5 MG/5ML PO SYRP: 5.0000 mL | ORAL_SOLUTION | Freq: Every evening | ORAL | 0 refills | Status: DC | PRN

## 2018-02-02 MED ORDER — FLUTTER DEVI: 0 refills | Status: DC

## 2018-02-02 MED ORDER — DOXYCYCLINE HYCLATE 100 MG PO TABS: 100.0000 mg | ORAL_TABLET | Freq: Two times a day (BID) | ORAL | 0 refills | Status: DC

## 2018-02-02 NOTE — Progress Notes (Signed)
@Patient  ID: Claudia Joseph, adult    DOB: 04-Jan-1946, 73 y.o.   MRN: [id number]  Chief Complaint  Patient presents with  . Acute Visit    cough with yellow to clear mucus x6 days    Referring provider: No ref. provider found  HPI: 73 year old female, never smoked. PMH significant for hypertension, bronchiectasis, chronic seasonal allergies, mold exposure, hypersensitivity pneumonitis, pulmonary nodules. HRCT in October 2019 showed moderate air trapping and scattered areas of mild cylindrical bronchiectasis in right middle and lower lobes.  02/02/2018 Patient presents today with acute complaints of productive cough x 6 days. States that her cough was so bad last night she had trouble breathing. Family friend/physican called in prednisone, she was given 80mg  taper. Took last night and could not sleep with reports of heart racing. Doing better today, still has hacking cough with some associated shortness of breath. No rescue inhaler. Afebrile.    Allergies  Allergen Reactions  . Meperidine Hcl     Demerol causes severe HAs  . Morphine And Related     Causes bad headaches per patient    There is no immunization history for the selected administration types on file for this patient.  Past Medical History:  Diagnosis Date  . GERD (gastroesophageal reflux disease)   . Head pain, chronic    history of  . Hernia   . Hypertension     Tobacco History: Social History   Tobacco Use  Smoking Status Never Smoker  Smokeless Tobacco Never Used   Counseling given: Not Answered   Outpatient Medications Prior to Visit  Medication Sig Dispense Refill  . amLODipine (NORVASC) 10 MG tablet Take 10 mg by mouth daily.    . cholecalciferol (VITAMIN D) 1000 units tablet Take 1,000 Units by mouth daily.    . metoprolol succinate (TOPROL-XL) 25 MG 24 hr tablet Take 25 mg by mouth daily.  0  . potassium chloride (K-DUR,KLOR-CON) 10 MEQ tablet Take 1 tablet by mouth 2 (two) times daily.    .  Potassium Chloride ER 20 MEQ TBCR Take by mouth.    . RABEprazole (ACIPHEX) 20 MG tablet Take 20 mg by mouth daily.  3  . TURMERIC PO Take by mouth.    . benzonatate (TESSALON) 200 MG capsule Take one cap by mouth at bedtime as needed for cough.  May repeat in 4 to 6 hours 15 capsule 0   No facility-administered medications prior to visit.     Review of Systems  Review of Systems  Constitutional: Negative.   HENT: Negative.   Respiratory: Positive for cough and shortness of breath.   Cardiovascular: Negative.     Physical Exam  BP 120/74 (BP Location: Left Arm, Cuff Size: Normal)   Pulse 94   Temp 98 F (36.7 C)   Ht 5' 7.5" (1.715 m)   Wt 198 lb 12.8 oz (90.2 kg)   SpO2 97%   BMI 30.68 kg/m  Physical Exam Constitutional:      Appearance: Normal appearance. He is not ill-appearing.  HENT:     Head: Normocephalic and atraumatic.     Mouth/Throat:     Mouth: Mucous membranes are moist.     Pharynx: Oropharynx is clear. Uvula midline. Posterior oropharyngeal erythema present. No pharyngeal swelling or oropharyngeal exudate.  Neck:     Musculoskeletal: Normal range of motion and neck supple.  Cardiovascular:     Rate and Rhythm: Normal rate and regular rhythm.  Pulmonary:  Effort: Pulmonary effort is normal. No respiratory distress.     Breath sounds: No wheezing or rhonchi.  Musculoskeletal: Normal range of motion.  Skin:    General: Skin is warm and dry.  Neurological:     General: No focal deficit present.     Mental Status: He is alert and oriented to person, place, and time. Mental status is at baseline.  Psychiatric:        Mood and Affect: Mood normal.        Behavior: Behavior normal.        Thought Content: Thought content normal.        Judgment: Judgment normal.      Lab Results:  CBC    Component Value Date/Time   WBC 8.1 07/18/2015 1019   RBC 4.69 07/18/2015 1019   HGB 14.3 07/18/2015 1019   HCT 42.3 07/18/2015 1019   PLT 321.0 07/18/2015  1019   MCV 90.2 07/18/2015 1019   MCHC 33.9 07/18/2015 1019   RDW 12.8 07/18/2015 1019   LYMPHSABS 3.1 07/18/2015 1019   MONOABS 0.5 07/18/2015 1019   EOSABS 0.2 07/18/2015 1019   BASOSABS 0.1 07/18/2015 1019    BMET    Component Value Date/Time   NA 141 08/12/2007 0000   K 4.0 08/12/2007 0000   CL 108 08/12/2007 0000   CO2 23 08/12/2007 0000   GLUCOSE 97 08/12/2007 0000   BUN 14 08/12/2007 0000   CREATININE 0.78 08/12/2007 0000   CALCIUM 9.0 08/12/2007 0000    BNP No results found for: BNP  ProBNP No results found for: PROBNP  Imaging: No results found.   Assessment & Plan:   Bronchiectasis with (acute) exacerbation (HCC) - Adjust taper to 40mg  x 3 days; 30mg  x 3 days; 20mg  x 3 days and 10mg  x 3 days - RX doxycyline 1 tab BID x 7 days and Albuterol HFA prn - Hycodan at bedtime for cough, PMP reviewed no unexpected prescriptions found  - Needs flutter device  - Check Alpha-1 prior to next visit   FU in April with Dr. Lake Bells or NP   Martyn Ehrich, NP 02/02/2018

## 2018-02-02 NOTE — Assessment & Plan Note (Addendum)
-   Adjust prednisone taper to 40mg  x 3 days; 30mg  x 3 days; 20mg  x 3 days and 10mg  x 3 days - RX doxycyline 1 tab BID x 7 days and Albuterol HFA prn - Hycodan at bedtime for cough, PMP reviewed no unexpected prescriptions found  - Needs flutter device  - Check Alpha-1 prior to next visit

## 2018-02-02 NOTE — Patient Instructions (Addendum)
RX: - Doxycyline 1 tab twice day x 7 days - Please take Prednisone  40mg  x 3 days (2 tabs); 30mg  x 3 days (1.5 tabs); 20mg  x 3 days (1 tab); 10mg  x 3 days(half tab) - Albuterol rescue inhaler- take 2 puffs every 4-6 hours as needed for shortness of breath or wheezing  - Hycodan prescription cough syrup as prescribed (Do not take with other sedation medication, alcohol or drive while taking)  Recommendations: - Flutter valve 3-4 times a day for congestion  Follow up: - Dr. Lake Bells in March/April  (please get labs done prior)   Bronchiectasis  Bronchiectasis is a condition in which the airways in the lungs (bronchi) are damaged and widened. The condition makes it hard for the lungs to get rid of mucus, and it causes mucus to gather in the bronchi. This condition often leads to lung infections, which can make the condition worse. What are the causes? You can be born with this condition or you can develop it later in life. Common causes of this condition include:  Cystic fibrosis.  Repeated lung infections, such as pneumonia or tuberculosis.  An object or other blockage in the lungs.  Breathing in fluid, food, or other objects (aspiration).  A problem with the immune system and lung structure that is present at birth (congenital). Sometimes the cause is not known. What are the signs or symptoms? Common symptoms of this condition include:  A daily cough that brings up mucus and lasts for more than 3 weeks.  Lung infections that happen often.  Shortness of breath and wheezing.  Weakness and fatigue. How is this diagnosed? This condition is diagnosed with tests, such as:  Chest X-rays or CT scans. These are done to check for changes in the lungs.  Breathing tests. These are done to check how well your lungs are working.  A test of a sample of your saliva (sputum culture). This test is done to check for infection.  Blood tests and other tests. These are done to check for  related diseases or causes. How is this treated? Treatment for this condition depends on the severity of the illness and its cause. Treatment may include:  Medicines that loosen mucus so it can be coughed up (expectorants).  Medicines that relax the muscles of the bronchi (bronchodilators).  Antibiotic medicines to prevent or treat infection.  Physical therapy to help clear mucus from the lungs. Techniques may include: ? Postural drainage. This is when you sit or lie in certain positions so that mucus can drain by gravity. ? Chest percussion. This involves tapping the chest or back with a cupped hand. ? Chest vibration. For this therapy, a hand or special equipment vibrates your chest and back.  Surgery to remove the affected part of the lung. This may be done in severe cases. Follow these instructions at home: Medicines  Take over-the-counter and prescription medicines only as told by your health care provider.  If you were prescribed an antibiotic medicine, take it as told by your health care provider. Do not stop taking the antibiotic even if you start to feel better.  Avoid taking sedatives and antihistamines unless your health care provider tells you to take them. These medicines tend to thicken the mucus in the lungs. Managing symptoms  Perform breathing exercises or techniques to clear your lungs as told by your health care provider.  Consider using a cold steam vaporizer or humidifier in your room or home to help loosen secretions.  If  you have a cough that gets worse at night, try sleeping in a semi-upright position. General instructions  Get plenty of rest.  Drink enough fluid to keep your urine clear or pale yellow.  Stay inside when pollution and ozone levels are high.  Stay up to date with vaccinations and immunizations.  Avoid cigarette smoke and other lung irritants.  Do not use any products that contain nicotine or tobacco, such as cigarettes and  e-cigarettes. If you need help quitting, ask your health care provider.  Keep all follow-up visits as told by your health care provider. This is important. Contact a health care provider if:  You cough up more sputum than before and the sputum is yellow or green in color.  You have a fever.  You cannot control your cough and are losing sleep. Get help right away if:  You cough up blood.  You have chest pain.  You have increasing shortness of breath.  You have pain that gets worse or is not controlled with medicines.  You have a fever and your symptoms suddenly get worse. Summary  Bronchiectasis is a condition in which the airways in the lungs (bronchi) are damaged and widened. The condition makes it hard for the lungs to get rid of mucus, and it causes mucus to gather in the bronchi.  Treatment usually includes therapy to help clear mucus from the lungs.  Stay up to date with vaccinations and immunizations. This information is not intended to replace advice given to you by your health care provider. Make sure you discuss any questions you have with your health care provider. Document Released: 11/02/2006 Document Revised: 02/10/2016 Document Reviewed: 02/10/2016 Elsevier Interactive Patient Education  2019 [employer].

## 2018-02-02 NOTE — Progress Notes (Signed)
Reviewed, agree 

## 2018-02-03 ENCOUNTER — Telehealth: Payer: Self-pay | Admitting: Pulmonary Disease

## 2018-02-03 NOTE — Telephone Encounter (Signed)
Pt states given flutter valve but was not shown how to use it  I explained to her over the phone  She tried using it on the phone and I could hear the flutter  She felt like she got in and declined my offer to have her come in for a demonstration  I apologized it was not made clear and her ov  She will call if needed

## 2018-02-25 ENCOUNTER — Other Ambulatory Visit: Payer: Self-pay | Admitting: Primary Care

## 2018-03-01 NOTE — Telephone Encounter (Signed)
Received refill request of Hycodan cough syrup. Beth, please advise if it is okay to refill med for pt. Thanks!

## 2018-04-08 ENCOUNTER — Ambulatory Visit: Payer: Medicare Other | Admitting: Pulmonary Disease

## 2018-05-13 ENCOUNTER — Ambulatory Visit: Payer: Medicare Other | Admitting: Pulmonary Disease

## 2018-06-23 ENCOUNTER — Encounter: Payer: Self-pay | Admitting: Adult Health

## 2018-06-23 ENCOUNTER — Other Ambulatory Visit: Payer: Self-pay

## 2018-06-23 ENCOUNTER — Other Ambulatory Visit: Payer: Medicare Other

## 2018-06-23 ENCOUNTER — Ambulatory Visit: Payer: Medicare Other | Admitting: Adult Health

## 2018-06-23 ENCOUNTER — Telehealth: Payer: Self-pay | Admitting: *Deleted

## 2018-06-23 VITALS — BP 118/82 | HR 89 | Temp 97.9°F | Ht 67.5 in | Wt 200.8 lb

## 2018-06-23 DIAGNOSIS — J029 Acute pharyngitis, unspecified: Secondary | ICD-10-CM | POA: Diagnosis not present

## 2018-06-23 DIAGNOSIS — Z20822 Contact with and (suspected) exposure to covid-19: Secondary | ICD-10-CM

## 2018-06-23 DIAGNOSIS — J302 Other seasonal allergic rhinitis: Secondary | ICD-10-CM

## 2018-06-23 LAB — STREP COMPLETE PANEL: Strep Pyogenes: NEGATIVE

## 2018-06-23 NOTE — Progress Notes (Signed)
@Patient  ID: Claudia Joseph, female    DOB: 11/17/1945, 73 y.o.   MRN: [id number]  Chief Complaint  Patient presents with  . Follow-up    AR     Referring provider: No ref. provider found  HPI: 73 year old female never smoker followed for allergic rhinitis and pulmonary nodules /bronchiectasis    TEST Joya San  05/14/16: FVC 2.92L (89%) FEV1 2.43 L (98%) FEV1/FVC 0.83 FEF 25-75 2.88 L (140%)  negative bronchodilator response TLC 4.85 L (88%) RV 83% ERV 138% DLCO corrected 76% 04/27/11: FVC 2.98 L (87%) FEV1 2.47 L (94%) FEV1/FVC 0.83 FEF 25-75 2.79 L (124%)  IMAGING CT CHEST W/O 06/08/13 (per radiologist): Two 4 mm nodules left lower lobe unchanged from prior CT imaging. 3 mm left upper lobe nodule unchanged. No new nodule. No axillary or supraclavicular lymphadenopathy. No mediastinal or hilar adenopathy. Nodule stable going back to 10/08/11  High-resolution CT chest October 2019 showed mild bronchiectasis most evident in the right lower and right middle lobes.  Several tiny 2 to 4 mm nodules throughout stable since 2015  LABS 07/18/15 CBC: 8.1/14.3/42.3/321 Eosinophils: 0.2 IgE: 18 RAST panel: Negative  04/27/11 IgE: 12.8 Hypersensitivity Pneumonitis Panel: Aspergillus pullulans 1 band detected    06/23/2018 Follow up : AR  Patient presents for a 7-month follow-up.  Patient has underlying allergic rhinitis. Patient complains of the last week that she has had increased cough.  Increased nasal drainage congestion.  Says she has been working outside and felt like this was possibly what was contributing to this.  She denies any fever, discolored mucus chest pain orthopnea or leg swelling She had no recent travel or known sick contacts. Has not used any medications for symptoms High-resolution CT chest showed stable mild bronchiectasis in October 2019 and stable tiny nodules since 2015.   Allergies  Allergen Reactions  . Meperidine Hcl     Demerol causes severe HAs  .  Morphine And Related     Causes bad headaches per patient    There is no immunization history for the selected administration types on file for this patient.  Past Medical History:  Diagnosis Date  . GERD (gastroesophageal reflux disease)   . Head pain, chronic    history of  . Hernia   . Hypertension     Tobacco History: Social History   Tobacco Use  Smoking Status Never Smoker  Smokeless Tobacco Never Used   Counseling given: Not Answered   Outpatient Medications Prior to Visit  Medication Sig Dispense Refill  . albuterol (PROVENTIL HFA;VENTOLIN HFA) 108 (90 Base) MCG/ACT inhaler Inhale 2 puffs into the lungs every 6 (six) hours as needed for wheezing or shortness of breath. 1 Inhaler 6  . amLODipine (NORVASC) 10 MG tablet Take 10 mg by mouth daily.    . cholecalciferol (VITAMIN D) 1000 units tablet Take 1,000 Units by mouth daily.    . metoprolol succinate (TOPROL-XL) 25 MG 24 hr tablet Take 25 mg by mouth daily.  0  . potassium chloride (K-DUR,KLOR-CON) 10 MEQ tablet Take 1 tablet by mouth 2 (two) times daily.    . RABEprazole (ACIPHEX) 20 MG tablet Take 20 mg by mouth daily.  3  . Respiratory Therapy Supplies (FLUTTER) DEVI Use flutter valve 3 times a day (Patient not taking: Reported on 06/23/2018) 1 each 0  . doxycycline (VIBRA-TABS) 100 MG tablet Take 1 tablet (100 mg total) by mouth 2 (two) times daily. 14 tablet 0  . HYDROcodone-homatropine (HYCODAN) 5-1.5 MG/5ML syrup  Take 5 mLs by mouth at bedtime and may repeat dose one time if needed. (Patient not taking: Reported on 06/23/2018) 120 mL 0  . Potassium Chloride ER 20 MEQ TBCR Take by mouth.     No facility-administered medications prior to visit.      Review of Systems:   Constitutional:   No  weight loss, night sweats,  Fevers, chills, fatigue, or  lassitude.  HEENT:   No headaches,  Difficulty swallowing,  Tooth/dental problems, or  Sore throat,                No sneezing, itching, ear ache, + nasal  congestion, post nasal drip,   CV:  No chest pain,  Orthopnea, PND, swelling in lower extremities, anasarca, dizziness, palpitations, syncope.   GI  No heartburn, indigestion, abdominal pain, nausea, vomiting, diarrhea, change in bowel habits, loss of appetite, bloody stools.   Resp: No shortness of breath with exertion or at rest.  No excess mucus, no productive cough,  No non-productive cough,  No coughing up of blood.  No change in color of mucus.  No wheezing.  No chest wall deformity  Skin: no rash or lesions.  GU: no dysuria, change in color of urine, no urgency or frequency.  No flank pain, no hematuria   MS:  No joint pain or swelling.  No decreased range of motion.  No back pain.    Physical Exam  BP 118/82 (BP Location: Left Arm, Cuff Size: Normal)   Pulse 89   Temp 97.9 F (36.6 C) (Oral)   Ht 5' 7.5" (1.715 m)   Wt 200 lb 12.8 oz (91.1 kg)   SpO2 97%   BMI 30.99 kg/m   GEN: A/Ox3; pleasant , NAD, well nourished    HEENT:  [place]/AT,  EACs-clear, TMs-wnl, NOSE-clear, THROAT-red with white drainage   NECK:  Supple w/ fair ROM; no JVD; normal carotid impulses w/o bruits; no thyromegaly or nodules palpated; no lymphadenopathy.    RESP  Clear  P & A; w/o, wheezes/ rales/ or rhonchi. no accessory muscle use, no dullness to percussion  CARD:  RRR, no m/r/g, no peripheral edema, pulses intact, no cyanosis or clubbing.  GI:   Soft & nt; nml bowel sounds; no organomegaly or masses detected.   Musco: Warm bil, no deformities or joint swelling noted.   Neuro: alert, no focal deficits noted.    Skin: Warm, no lesions or rashes    Lab Results:   BNP No results found for: BNP  ProBNP No results found for: PROBNP  Imaging: No results found.    PFT Results Latest Ref Rng & Units 05/14/2016  FVC-Pre L 2.92  FVC-Predicted Pre % 89  FVC-Post L 2.83  FVC-Predicted Post % 86  Pre FEV1/FVC % % 83  Post FEV1/FCV % % 89  FEV1-Pre L 2.43  FEV1-Predicted Pre % 98   FEV1-Post L 2.52  DLCO UNC% % 79  DLCO COR %Predicted % 94  TLC L 4.85  TLC % Predicted % 88  RV % Predicted % 83    No results found for: NITRICOXIDE      Assessment & Plan:   Chronic seasonal allergic rhinitis Suspect this is a flare .  Strep test is neg.  Due to increased cough and resp sx , will check for CoVID 19 (self isolate -pat aware )  tx sx with OTC rx , if not improving will need further evaluation   Plan  Patient Instructions  Strep  test pending  COVID 19 test order, they will call you with time and directions.  Saline nasal rinses As needed   Flonase 2 puffs daily for 1 week , then As needed   Claritin 10mg  daily for 1 week then as needed.  Chortabs 4mg  At bedtime  As needed  Drainage .  Deslym 2 tsp Twice daily  As needed  Cough .  Follow up with Dr. Lake Bells in 4 -6 months with chest xray and As needed   (Check Alpha 1 test on return visit with labs)           Rexene Edison, NP 06/23/2018

## 2018-06-23 NOTE — Telephone Encounter (Signed)
Pt has been scheduled for Covid-19 testing.    Pt was referred by: Melvenia Needles, NP

## 2018-06-23 NOTE — Telephone Encounter (Signed)
-----   Message from Valerie Salts, [place] sent at 06/23/2018 10:01 AM EDT ----- Patient was seen in the office today. Provider would like for patient to be tested for COVID. Symptoms of cough and sore throat.   Thank you!

## 2018-06-23 NOTE — Addendum Note (Signed)
Addended by: Denman George on: 06/23/2018 11:07 AM   Modules accepted: Orders

## 2018-06-23 NOTE — Assessment & Plan Note (Signed)
Suspect this is a flare .  Strep test is neg.  Due to increased cough and resp sx , will check for CoVID 19 (self isolate -pat aware )  tx sx with OTC rx , if not improving will need further evaluation   Plan  Patient Instructions  Strep test pending  COVID 19 test order, they will call you with time and directions.  Saline nasal rinses As needed   Flonase 2 puffs daily for 1 week , then As needed   Claritin 10mg  daily for 1 week then as needed.  Chortabs 4mg  At bedtime  As needed  Drainage .  Deslym 2 tsp Twice daily  As needed  Cough .  Follow up with Dr. Lake Bells in 4 -6 months with chest xray and As needed   (Check Alpha 1 test on return visit with labs)

## 2018-06-23 NOTE — Patient Instructions (Addendum)
Strep test pending  COVID 19 test order, they will call you with time and directions.  Saline nasal rinses As needed   Flonase 2 puffs daily for 1 week , then As needed   Claritin 10mg  daily for 1 week then as needed.  Chortabs 4mg  At bedtime  As needed  Drainage .  Deslym 2 tsp Twice daily  As needed  Cough .  Follow up with Dr. Lake Bells in 4 -6 months with chest xray and As needed   (Check Alpha 1 test on return visit with labs)

## 2018-06-25 LAB — NOVEL CORONAVIRUS, NAA: SARS-CoV-2, NAA: NOT DETECTED

## 2018-06-25 NOTE — Progress Notes (Signed)
Reviewed, agree 

## 2018-10-25 ENCOUNTER — Ambulatory Visit: Payer: Medicare Other | Admitting: Pulmonary Disease

## 2018-11-02 ENCOUNTER — Telehealth: Payer: Self-pay | Admitting: Pulmonary Disease

## 2018-11-02 NOTE — Telephone Encounter (Signed)
Called and spoke to patient. Relayed message per Dr. Valeta Harms. Nothing further needed at this time.

## 2018-11-02 NOTE — Telephone Encounter (Signed)
Called and spoke to patient. Patient stated that she had a covid exposure 2 1/2weeks ago. Patient reported that she went to her hair dresser, patient and hair dresser were masked the entire time.  Patient stated she was called 3 days later by the hair dresser and was told that she tested positive. Patient reports she has been quarantining at home since her hair dresser called.  Patient reported that she has chronic symptoms of cough and sinus drainage but nothing new or acute.  Patient wants to make sure she can come to her office visit tomorrow.  Dr. Valeta Harms please advise. Thank you!

## 2018-11-02 NOTE — Telephone Encounter (Signed)
PCCM:  I am ok with seeing her as it has been >14 days. Must use continued masking and handwashing in the office.   [facility] 11/02/2018 1:22 PM

## 2018-11-03 ENCOUNTER — Ambulatory Visit: Payer: Medicare Other | Admitting: Pulmonary Disease

## 2018-11-03 ENCOUNTER — Encounter: Payer: Self-pay | Admitting: Pulmonary Disease

## 2018-11-03 ENCOUNTER — Other Ambulatory Visit: Payer: Self-pay

## 2018-11-03 VITALS — BP 120/72 | HR 65 | Ht 67.5 in | Wt 200.0 lb

## 2018-11-03 DIAGNOSIS — J302 Other seasonal allergic rhinitis: Secondary | ICD-10-CM | POA: Diagnosis not present

## 2018-11-03 DIAGNOSIS — J679 Hypersensitivity pneumonitis due to unspecified organic dust: Secondary | ICD-10-CM | POA: Diagnosis not present

## 2018-11-03 DIAGNOSIS — I2510 Atherosclerotic heart disease of native coronary artery without angina pectoris: Secondary | ICD-10-CM

## 2018-11-03 DIAGNOSIS — R0609 Other forms of dyspnea: Secondary | ICD-10-CM

## 2018-11-03 DIAGNOSIS — J3089 Other allergic rhinitis: Secondary | ICD-10-CM

## 2018-11-03 DIAGNOSIS — R0600 Dyspnea, unspecified: Secondary | ICD-10-CM

## 2018-11-03 NOTE — Progress Notes (Signed)
Synopsis: Referred in October 2020, establish care with new pulmonary provider, former patient Dr. Lake Bells   Subjective:   PATIENT ID: Claudia Joseph GENDER: female DOB: 04-26-1945, MRN: [id number]  Chief Complaint  Patient presents with   Follow-up    Former patient of Dr. Ashok Cordia.  Followed for dyspnea pulmonary nodules allergic rhinitis.  2013 there was some concern for mold damage in the home requiring mediation.  Hypersensitivity pneumonitis testing at the time revealed 1 band slightly positive Aspergillus species.  She had moved out of the house for 8 weeks out was being remediated.  CT scan of the chest had multiple pulmonary nodules.  She had a follow-up CT in October 2019 which revealed stable nodules in comparison to her previous 2015 CT.  He had no evidence of interstitial lung disease a small amount of air trapping and mild cylindrical bronchiectasis.  A diagnosis of hypersensitivity pneumonitis was made in 2013 due to the mold exposure.  Since then she has seem to have continued respiratory problems recurrent issues with cough.  OV 11/03/2018: Here today for follow-up.  Still has recurrent allergic rhinitis symptoms.  Persistent nasal drainage.  She started Flonase spray 3 weeks ago.  She thinks it may have helped some.  Respiratory complaints are relatively benign.  She does notice significant dyspnea on exertion when she exerts herself.  She does think that her physical activity has decreased recently in the coming months due to [place] and being at home.  She also knows that she would like to lose some weight.  HRCT that was recently done revealed atherosclerosis within the LAD.  She has had a stress test several years ago.  She is concerned about this finding.  Denies chest pain.     Past Medical History:  Diagnosis Date   GERD (gastroesophageal reflux disease)    Head pain, chronic    history of   Hernia    Hypertension      Family History  Problem Relation Age of  Onset   Leukemia Father        deceased   Pancreatic cancer Maternal Grandmother    Tongue cancer Brother    Lung disease Neg Hx      Past Surgical History:  Procedure Laterality Date   APPENDECTOMY     CHOLECYSTECTOMY     COLONOSCOPY     TONSILLECTOMY     TUBAL LIGATION      Social History   Socioeconomic History   Marital status: Married    Spouse name: Not on file   Number of children: 3   Years of education: Not on file   Highest education level: Not on file  Occupational History   Occupation: Psychologist, clinical strain: Not on file   Food insecurity    Worry: Not on file    Inability: Not on file   Transportation needs    Medical: Not on file    Non-medical: Not on file  Tobacco Use   Smoking status: Never Smoker   Smokeless tobacco: Never Used  Substance and Sexual Activity   Alcohol use: No   Drug use: No   Sexual activity: Not on file  Lifestyle   Physical activity    Days per week: Not on file    Minutes per session: Not on file   Stress: Not on file  Relationships   Social connections    Talks on phone: Not on file    Gets  together: Not on file    Attends religious service: Not on file    Active member of club or organization: Not on file    Attends meetings of clubs or organizations: Not on file    Relationship status: Not on file   Intimate partner violence    Fear of current or ex partner: Not on file    Emotionally abused: Not on file    Physically abused: Not on file    Forced sexual activity: Not on file  Other Topics Concern   Not on file  Social History Narrative   Originally from [place]. Previously had mold exposure in her home that underwent professional remediation. 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 No pets currently. No bird or hot tub exposure. 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      Allergies  Allergen Reactions    Meperidine Hcl     Demerol causes severe HAs   Morphine And Related     Causes bad headaches per patient     Outpatient Medications Prior to Visit  Medication Sig Dispense Refill   albuterol (PROVENTIL HFA;VENTOLIN HFA) 108 (90 Base) MCG/ACT inhaler Inhale 2 puffs into the lungs every 6 (six) hours as needed for wheezing or shortness of breath. 1 Inhaler 6   amLODipine (NORVASC) 10 MG tablet Take 10 mg by mouth daily.     cholecalciferol (VITAMIN D) 1000 units tablet Take 1,000 Units by mouth daily.     metoprolol succinate (TOPROL-XL) 25 MG 24 hr tablet Take 25 mg by mouth daily.  0   potassium chloride (K-DUR,KLOR-CON) 10 MEQ tablet Take 1 tablet by mouth 2 (two) times daily.     RABEprazole (ACIPHEX) 20 MG tablet Take 20 mg by mouth daily.  3   Respiratory Therapy Supplies (FLUTTER) DEVI Use flutter valve 3 times a day (Patient not taking: Reported on 06/23/2018) 1 each 0   No facility-administered medications prior to visit.     Review of Systems  Constitutional: Negative for chills, fever, malaise/fatigue and weight loss.  HENT: Positive for congestion. Negative for hearing loss, sore throat and tinnitus.        Recurrent posterior drip.  Eyes: Negative for blurred vision and double vision.  Respiratory: Negative for cough, hemoptysis, sputum production, wheezing and stridor.   Cardiovascular: Negative for chest pain, palpitations, orthopnea, leg swelling and PND.  Gastrointestinal: Negative for abdominal pain, constipation, diarrhea, heartburn, nausea and vomiting.  Genitourinary: Negative for dysuria, hematuria and urgency.  Musculoskeletal: Negative for joint pain and myalgias.  Skin: Negative for itching and rash.  Neurological: Negative for dizziness, tingling, weakness and headaches.  Endo/Heme/Allergies: Positive for environmental allergies. Does not bruise/bleed easily.  Psychiatric/Behavioral: Negative for depression. The patient is not nervous/anxious and does not  have insomnia.   All other systems reviewed and are negative.    Objective:  Physical Exam Vitals signs reviewed.  Constitutional:      General: She is not in acute distress.    Appearance: She is well-developed. She is obese.  HENT:     Head: Normocephalic and atraumatic.  Eyes:     General: No scleral icterus.    Conjunctiva/sclera: Conjunctivae normal.     Pupils: Pupils are equal, round, and reactive to light.  Neck:     Musculoskeletal: Neck supple.     Vascular: No JVD.     Trachea: No tracheal deviation.  Cardiovascular:     Rate and Rhythm: Normal rate  and regular rhythm.     Heart sounds: Normal heart sounds. No murmur.  Pulmonary:     Effort: Pulmonary effort is normal. No tachypnea, accessory muscle usage or respiratory distress.     Breath sounds: Normal breath sounds. No stridor. No wheezing, rhonchi or rales.  Abdominal:     General: Bowel sounds are normal. There is no distension.     Palpations: Abdomen is soft.     Tenderness: There is no abdominal tenderness.  Musculoskeletal:        General: No tenderness.  Lymphadenopathy:     Cervical: No cervical adenopathy.  Skin:    General: Skin is warm and dry.     Capillary Refill: Capillary refill takes less than 2 seconds.     Findings: No rash.  Neurological:     Mental Status: She is alert and oriented to person, place, and time.  Psychiatric:        Behavior: Behavior normal.      Vitals:   11/03/18 1128  BP: 120/72  Pulse: 65  SpO2: 97%  Weight: 200 lb (90.7 kg)  Height: 5' 7.5" (1.715 m)   97% on RA BMI Readings from Last 3 Encounters:  11/03/18 30.86 kg/m  06/23/18 30.99 kg/m  02/02/18 30.68 kg/m   Wt Readings from Last 3 Encounters:  11/03/18 200 lb (90.7 kg)  06/23/18 200 lb 12.8 oz (91.1 kg)  02/02/18 198 lb 12.8 oz (90.2 kg)     CBC    Component Value Date/Time   WBC 8.1 07/18/2015 1019   RBC 4.69 07/18/2015 1019   HGB 14.3 07/18/2015 1019   HCT 42.3 07/18/2015 1019    PLT 321.0 07/18/2015 1019   MCV 90.2 07/18/2015 1019   MCHC 33.9 07/18/2015 1019   RDW 12.8 07/18/2015 1019   LYMPHSABS 3.1 07/18/2015 1019   MONOABS 0.5 07/18/2015 1019   EOSABS 0.2 07/18/2015 1019   BASOSABS 0.1 07/18/2015 1019     Chest Imaging: October 2019: High-resolution CT of the chest No findings of ILD, moderate air trapping, mild bronchiectasis, athero-sclerotic disease, left main coronary artery. The patient's images have been independently reviewed by me.    Pulmonary Functions Testing Results: PFT Results Latest Ref Rng & Units 05/14/2016  FVC-Pre L 2.92  FVC-Predicted Pre % 89  FVC-Post L 2.83  FVC-Predicted Post % 86  Pre FEV1/FVC % % 83  Post FEV1/FCV % % 89  FEV1-Pre L 2.43  FEV1-Predicted Pre % 98  FEV1-Post L 2.52  DLCO UNC% % 79  DLCO COR %Predicted % 94  TLC L 4.85  TLC % Predicted % 88  RV % Predicted % 83    FeNO: None  Pathology: None  Echocardiogram: None  Heart Catheterization: None     Assessment & Plan:     ICD-10-CM   1. DOE (dyspnea on exertion)  R06.00 Ambulatory referral to Cardiology  2. Coronary artery disease involving native coronary artery of native heart without angina pectoris  I25.10 Ambulatory referral to Cardiology  3. Chronic seasonal allergic rhinitis  J30.2   4. Hypersensitivity pneumonitis ([place])  J67.9   5. 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  HRCT with evidence of calcification within the coronary arteries.  She still has continued sinus symptoms.  Her otherwise pulmonary symptoms are unchanged.  She does not have any significant chest congestion.  She may very well have a mild  asthma type symptoms but otherwise has been relatively well controlled.  Her major complaints are this dyspnea on exertion and her nasal posterior drainage symptoms.  Plan:  Continue Flonase nasal spray I have added to take an  over-the-counter antihistamine that she she needs to take daily. I have placed a referral to cardiology.  It would like their input regarding the coronary disease seen on HRCT.  As well as the evaluation of her dyspnea on exertion to see if these are related.  Unsure if she would benefit from any further testing regarding this.  Patient return to see [place] in 6 months.  If she has continued drainage symptoms and sinus symptoms I think that she needs to be evaluated by ENT.  We discussed this again today.  She does not want to see ENT yet to see if her symptoms will subside on her own.  She wants to increase her physical activity and exercise more as well.  And I encouraged this.  Greater than 50% of this patient's 25-minute of visit was spent face-to-face discussing above recommendations and treatment plan.   Current Outpatient Medications:    albuterol (PROVENTIL HFA;VENTOLIN HFA) 108 (90 Base) MCG/ACT inhaler, Inhale 2 puffs into the lungs every 6 (six) hours as needed for wheezing or shortness of breath., Disp: 1 Inhaler, Rfl: 6   amLODipine (NORVASC) 10 MG tablet, Take 10 mg by mouth daily., Disp: , Rfl:    cholecalciferol (VITAMIN D) 1000 units tablet, Take 1,000 Units by mouth daily., Disp: , Rfl:    metoprolol succinate (TOPROL-XL) 25 MG 24 hr tablet, Take 25 mg by mouth daily., Disp: , Rfl: 0   potassium chloride (K-DUR,KLOR-CON) 10 MEQ tablet, Take 1 tablet by mouth 2 (two) times daily., Disp: , Rfl:    RABEprazole (ACIPHEX) 20 MG tablet, Take 20 mg by mouth daily., Disp: , Rfl: 3   Garner Nash, DO [facility] 11/03/2018 11:49 AM

## 2018-11-03 NOTE — Patient Instructions (Signed)
Thank you for visiting Dr. Valeta Harms at [facility]. Today we recommend the following: Orders Placed This Encounter  Procedures  . Ambulatory referral to Cardiology   Start over the counter zyrtec daily  If your sinus symptoms continue you will need to be seen by ENT  Return in about 6 months (around 05/04/2019).    Please do your part to reduce the spread of COVID-19.

## 2018-12-12 NOTE — Progress Notes (Deleted)
Cardiology Office Note:    Date:  12/12/2018   ID:  Claudia Joseph, DOB [birth year], MRN [id number]  PCP:  System, Provider Not In  Cardiologist:  Shirlee More, MD   Referring MD: Garner Nash, DO  ASSESSMENT:    No diagnosis found. PLAN:    In order of problems listed above:  1. ***  Next appointment   Medication Adjustments/Labs and Tests Ordered: Current medicines are reviewed at length with the patient today.  Concerns regarding medicines are outlined above.  No orders of the defined types were placed in this encounter.  No orders of the defined types were placed in this encounter.    No chief complaint on file. ***  History of Present Illness:    Claudia Joseph is a 73 y.o. female who is being seen today for the evaluation of coronary artery calcification on chest CT 10/27/2088 at the request of Icard, [place], DO.  I reviewed previous CTs of the chest back to 2013 and there is no comment on coronary artery calcification on previous examinations.  CT chest report:FINDINGS: Cardiovascular: Heart size is normal. There is no significant pericardial fluid, thickening or pericardial calcification. There is aortic atherosclerosis, as well as atherosclerosis of the great vessels of the mediastinum and the coronary arteries, including calcified atherosclerotic plaque in the left main coronary artery.   Past Medical History:  Diagnosis Date  . GERD (gastroesophageal reflux disease)   . Head pain, chronic    history of  . Hernia   . Hypertension     Past Surgical History:  Procedure Laterality Date  . APPENDECTOMY    . CHOLECYSTECTOMY    . COLONOSCOPY    . TONSILLECTOMY    . TUBAL LIGATION      Current Medications: No outpatient medications have been marked as taking for the 12/13/18 encounter (Appointment) with Richardo Priest, MD.     Allergies:   Meperidine hcl and Morphine and related   Social History   Socioeconomic History  . Marital status:  Married    Spouse name: Not on file  . Number of children: 3  . Years of education: Not on file  . Highest education level: Not on file  Occupational History  . Occupation: Soil scientist  Social Needs  . Financial resource strain: Not on file  . Food insecurity    Worry: Not on file    Inability: Not on file  . Transportation needs    Medical: Not on file    Non-medical: Not on file  Tobacco Use  . Smoking status: Never Smoker  . Smokeless tobacco: Never Used  Substance and Sexual Activity  . Alcohol use: No  . Drug use: No  . Sexual activity: Not on file  Lifestyle  . Physical activity    Days per week: Not on file    Minutes per session: Not on file  . Stress: Not on file  Relationships  . Social Herbalist on phone: Not on file    Gets together: Not on file    Attends religious service: Not on file    Active member of club or organization: Not on file    Attends meetings of clubs or organizations: Not on file    Relationship status: Not on file  Other Topics Concern  . Not on file  Social History Narrative   Originally from [place]. Previously had mold exposure in her home that underwent professional remediation. 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 No pets currently. No bird or hot tub exposure. 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      Family History: The patient's ***family history includes Leukemia in her father; Pancreatic cancer in her maternal grandmother; Tongue cancer in her brother. There is no history of Lung disease.  ROS:   ROS Please see the history of present illness.    *** All other systems reviewed and are negative.  EKGs/Labs/Other Studies Reviewed:    The following studies were reviewed today: ***  EKG:  EKG is *** ordered today.  The ekg ordered today is personally reviewed and demonstrates ***  Recent Labs: No results found for requested labs within last 8760 hours.  Recent Lipid Panel     Component Value Date/Time   CHOL 212 (H) 08/12/2007 0000   TRIG 124 08/12/2007 0000   HDL 53 08/12/2007 0000   CHOLHDL 4.0 Ratio 08/12/2007 0000   VLDL 25 08/12/2007 0000   LDLCALC 134 (H) 08/12/2007 0000    Physical Exam:    VS:  There were no vitals taken for this visit.    Wt Readings from Last 3 Encounters:  11/03/18 200 lb (90.7 kg)  06/23/18 200 lb 12.8 oz (91.1 kg)  02/02/18 198 lb 12.8 oz (90.2 kg)     GEN: *** Well nourished, well developed in no acute distress HEENT: Normal NECK: No JVD; No carotid bruits LYMPHATICS: No lymphadenopathy CARDIAC: ***RRR, no murmurs, rubs, gallops RESPIRATORY:  Clear to auscultation without rales, wheezing or rhonchi  ABDOMEN: Soft, non-tender, non-distended MUSCULOSKELETAL:  No edema; No deformity  SKIN: Warm and dry NEUROLOGIC:  Alert and oriented x 3 PSYCHIATRIC:  Normal affect     Signed, Shirlee More, MD  12/12/2018 8:44 AM    [place]

## 2018-12-13 ENCOUNTER — Ambulatory Visit: Payer: Medicare Other | Admitting: Cardiology

## 2019-05-01 ENCOUNTER — Encounter: Payer: Medicare Other | Admitting: Sports Medicine

## 2019-05-09 ENCOUNTER — Ambulatory Visit (INDEPENDENT_AMBULATORY_CARE_PROVIDER_SITE_OTHER): Payer: Medicare Other | Admitting: Sports Medicine

## 2019-05-09 ENCOUNTER — Encounter: Payer: Self-pay | Admitting: Sports Medicine

## 2019-05-09 ENCOUNTER — Other Ambulatory Visit: Payer: Self-pay

## 2019-05-09 DIAGNOSIS — M170 Bilateral primary osteoarthritis of knee: Secondary | ICD-10-CM | POA: Diagnosis not present

## 2019-05-09 NOTE — Assessment & Plan Note (Signed)
This is a very pleasant 74 year old female, 4 years ago we did an injection in her right knee, she did well, continues to have good relief. More recently she started to have pain in her left knee, medial joint line, anteriorly, worse with prolonged weightbearing. She had x-rays at an outside orthopedic office that showed patellofemoral and medial compartment osteoarthritis. Today we performed an injection, she will borrow some of her family members Celebrex, and I can call her in some if she needs. Return to see me in 1 month.

## 2019-05-09 NOTE — Progress Notes (Signed)
    Procedures performed today:    Procedure: Real-time Ultrasound Guided injection of the left knee Device: Samsung HS60  Verbal informed consent obtained.  Time-out conducted.  Noted no overlying erythema, induration, or other signs of local infection.  Skin prepped in a sterile fashion.  Local anesthesia: Topical Ethyl chloride.  With sterile technique and under real time ultrasound guidance: 1 cc Kenalog 40, 2 cc lidocaine, 2 cc bupivacaine injected easily Completed without difficulty  Pain immediately resolved suggesting accurate placement of the medication.  Advised to call if fevers/chills, erythema, induration, drainage, or persistent bleeding.  Images permanently stored and available for review in the ultrasound unit.  Impression: Technically successful ultrasound guided injection.  Independent interpretation of notes and tests performed by another provider:   None.  Brief History, Exam, Impression, and Recommendations:    Primary osteoarthritis of both knees This is a very pleasant 74 year old female, 4 years ago we did an injection in her right knee, she did well, continues to have good relief. More recently she started to have pain in her left knee, medial joint line, anteriorly, worse with prolonged weightbearing. She had x-rays at an outside orthopedic office that showed patellofemoral and medial compartment osteoarthritis. Today we performed an injection, she will borrow some of her family members Celebrex, and I can call her in some if she needs. Return to see me in 1 month.    ___________________________________________ Gwen Her. Dianah Field, M.D., ABFM., CAQSM. Primary Care and [place] Instructor of [employer] [facility]

## 2019-05-16 ENCOUNTER — Telehealth: Payer: Self-pay | Admitting: *Deleted

## 2019-05-16 MED ORDER — CELECOXIB 200 MG PO CAPS: ORAL_CAPSULE | ORAL | 2 refills | Status: DC

## 2019-05-16 NOTE — Telephone Encounter (Signed)
Done

## 2019-05-16 NOTE — Telephone Encounter (Signed)
Patient called and states that you offered to give her Celebrex and she now wants to try it and she said you can call it into [facility]. Thank you much

## 2019-06-06 ENCOUNTER — Ambulatory Visit: Payer: Medicare Other | Admitting: Sports Medicine

## 2019-08-30 IMAGING — CT CT CHEST HIGH RESOLUTION W/O CM
2 of 5 series · 15 of 36 positions shown, 18 images · non-contrast
Comparison: Chest CT 06/08/2013.

CLINICAL DATA: 72-year-old female with history of organic dust
exposure with hypersensitivity pneumonitis. Exposed to molds spores
6 years ago, and again 1 month ago. Complaint of shortness of breath
with exertion. Productive cough for the past 3 weeks.

EXAM:
CT CHEST WITHOUT CONTRAST
TECHNIQUE: Multidetector CT imaging of the chest was performed following the
standard protocol without intravenous contrast. High resolution
imaging of the lungs, as well as inspiratory and expiratory imaging,
was performed.

[Series 2: thorax · axial · 0.61mm/px · z∈[+1005,+1305]mm · 12 of 166 slices shown, 15 images]
[im 8/166  mediastinal]
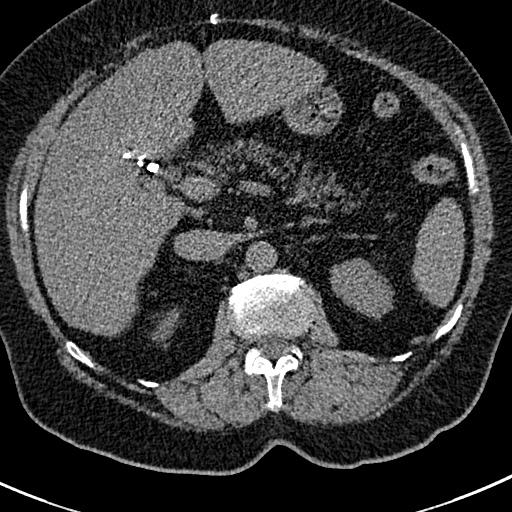
[im 8/166  lung]
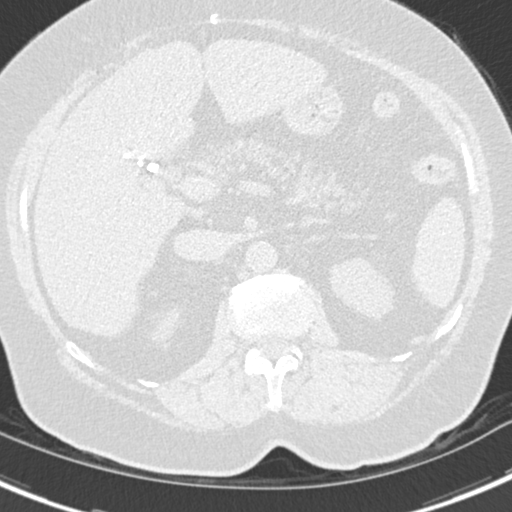
[im 22/166  lung]
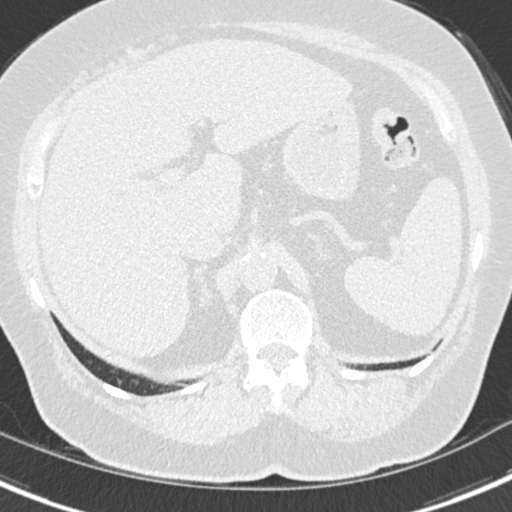
[im 36/166  lung]
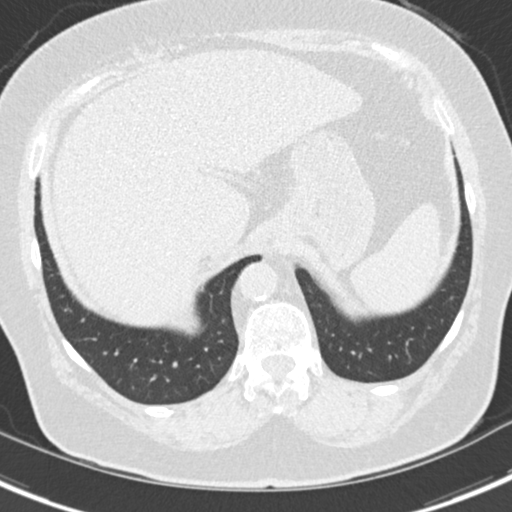
[im 51/166  lung]
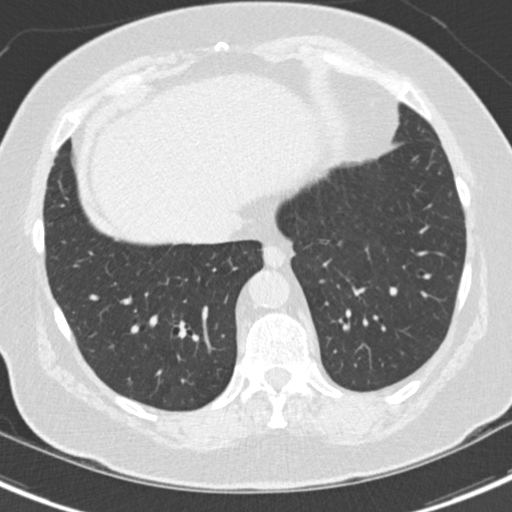
[im 65/166  mediastinal]
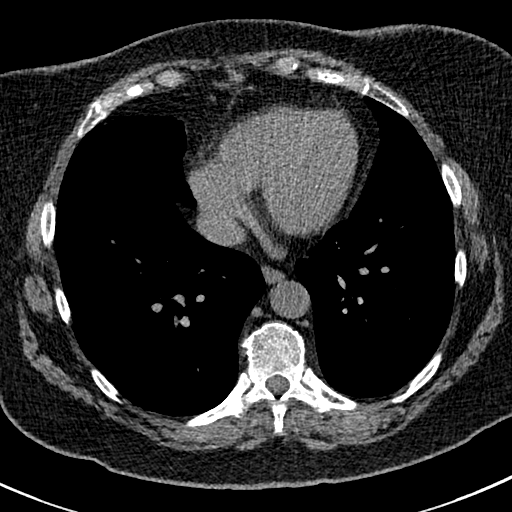
[im 65/166  lung]
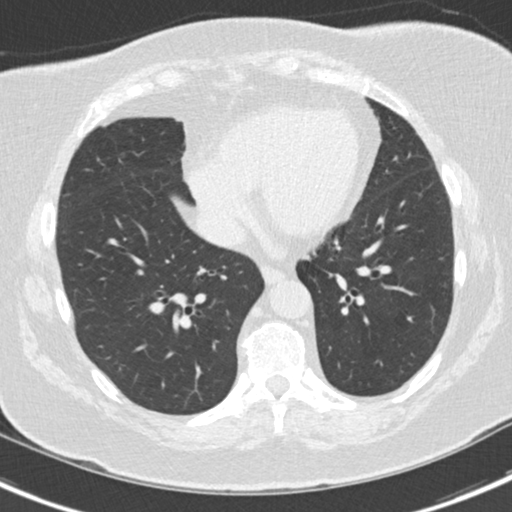
[im 79/166  lung]
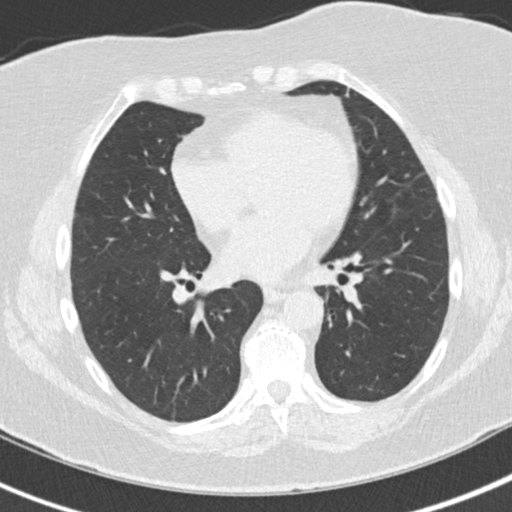
[im 87/166  lung]
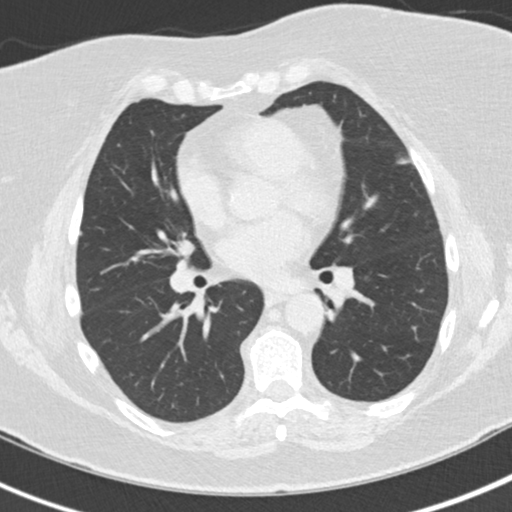
[im 101/166  lung]
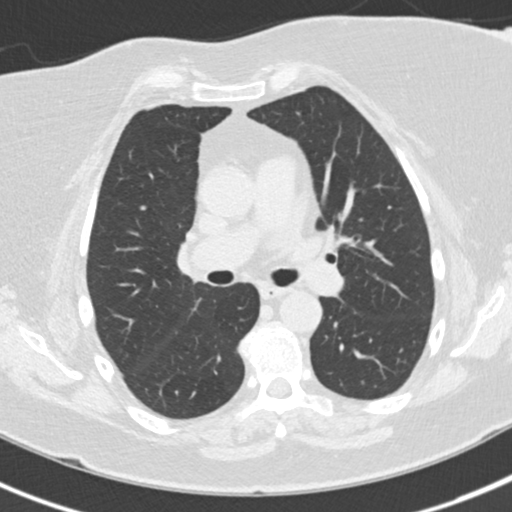
[im 115/166  mediastinal]
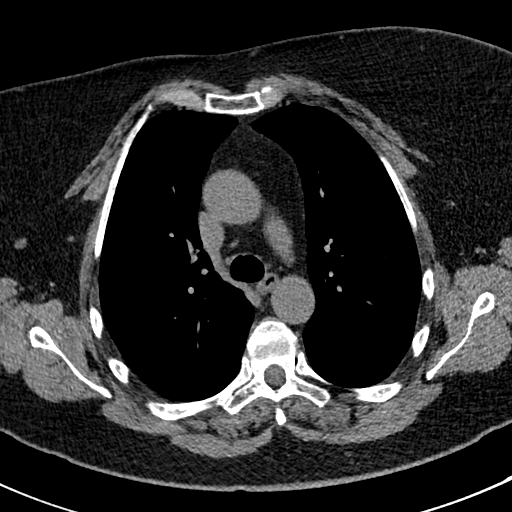
[im 115/166  lung]
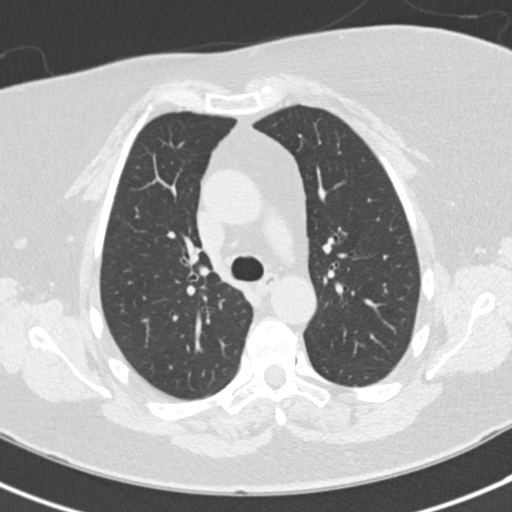
[im 130/166  lung]
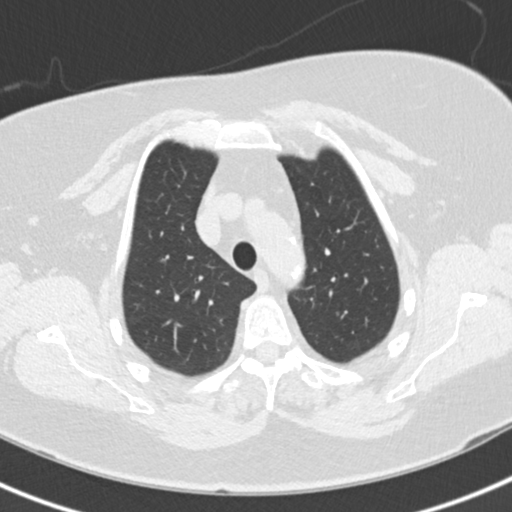
[im 144/166  lung]
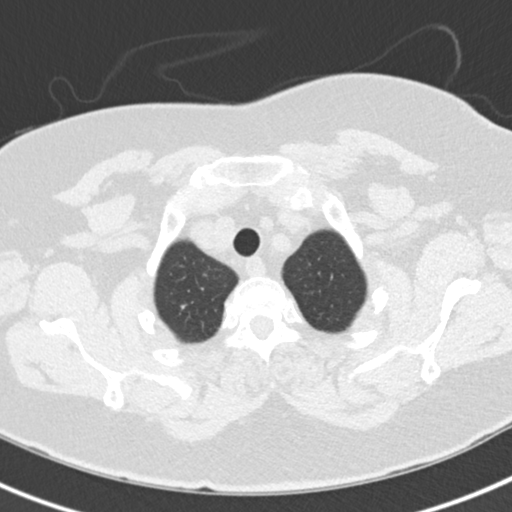
[im 158/166  lung]
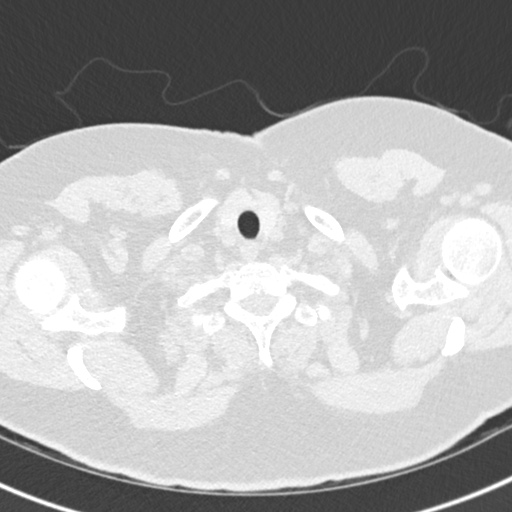

[Series 8: coronal · coronal · 0.66mm/px · 3 of 129 slices shown]
[im 26/129  lung]
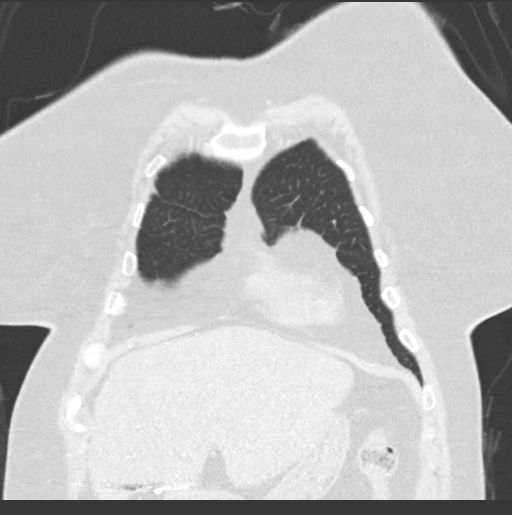
[im 52/129  lung]
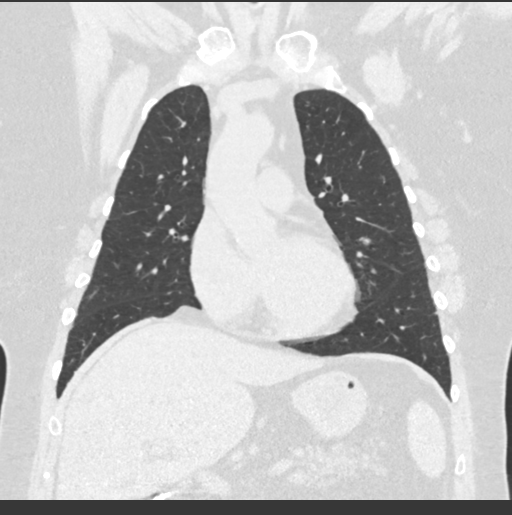
[im 77/129  lung]
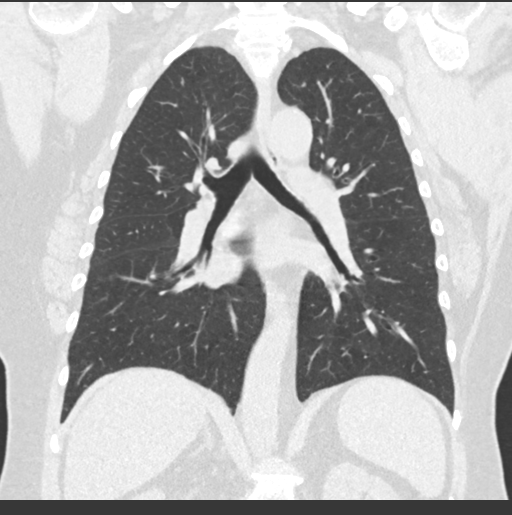

[15 of 36 positions shown; findings below may reference images not displayed]

FINDINGS: Cardiovascular: Heart size is normal. There is no significant
pericardial fluid, thickening or pericardial calcification. There is
aortic atherosclerosis, as well as atherosclerosis of the great
vessels of the mediastinum and the coronary arteries, including
calcified atherosclerotic plaque in the left main coronary artery.

Mediastinum/Nodes: No pathologically enlarged mediastinal or hilar
lymph nodes. Please note that accurate exclusion of hilar adenopathy
is limited on noncontrast CT scans. Esophagus is unremarkable in
appearance. No axillary lymphadenopathy.

Lungs/Pleura: High-resolution images demonstrate no significant
regions of ground-glass attenuation, subpleural reticulation,
parenchymal banding or frank honeycombing. Mild cylindrical
bronchiectasis most evident in the right lower and right middle
lobes. Inspiratory and expiratory imaging demonstrates moderate air
trapping, indicative of small airways disease. No acute
consolidative airspace disease. No pleural effusions. Several tiny
2-4 mm pulmonary nodules scattered throughout the lungs bilaterally,
stable in size, number and distribution to prior study from 0028,
considered definitively benign. No larger more suspicious appearing
pulmonary nodules or masses are noted.

Upper Abdomen: Diffuse low attenuation noted throughout the hepatic
parenchyma, indicative of hepatic steatosis. Status post
cholecystectomy. Surgical staples in the midline in the anterior
abdominal wall musculature.

Musculoskeletal: There are no aggressive appearing lytic or blastic
lesions noted in the visualized portions of the skeleton.
IMPRESSION: 1. No findings to suggest interstitial lung disease.
2. Moderate air trapping indicative of small airways disease.
3. Scattered areas of mild cylindrical bronchiectasis most evident
in the right middle and lower lobes.
4. Aortic atherosclerosis, in addition to left main coronary artery
disease. Assessment for potential risk factor modification, dietary
therapy or pharmacologic therapy may be warranted, if clinically
indicated.
5. Hepatic steatosis.
6. Additional incidental findings, as above.

Aortic Atherosclerosis ([id number]).

## 2020-04-24 ENCOUNTER — Ambulatory Visit (INDEPENDENT_AMBULATORY_CARE_PROVIDER_SITE_OTHER): Payer: Medicare Other | Admitting: Sports Medicine

## 2020-04-24 ENCOUNTER — Other Ambulatory Visit: Payer: Self-pay

## 2020-04-24 ENCOUNTER — Ambulatory Visit (INDEPENDENT_AMBULATORY_CARE_PROVIDER_SITE_OTHER): Payer: Medicare Other

## 2020-04-24 DIAGNOSIS — M170 Bilateral primary osteoarthritis of knee: Secondary | ICD-10-CM

## 2020-04-24 NOTE — Progress Notes (Signed)
    Procedures performed today:    Procedure: Real-time Ultrasound Guided aspiration/injection of left knee Device: Samsung HS60  Verbal informed consent obtained.  Time-out conducted.  Noted no overlying erythema, induration, or other signs of local infection.  Skin prepped in a sterile fashion.  Local anesthesia: Topical Ethyl chloride.  With sterile technique and under real time ultrasound guidance:  18-gauge needle advanced into the suprapatellar recess, noted moderate effusion, aspirated 10 cc of clear, straw-colored fluid, syringe switched and 1 cc Kenalog 40, 2 cc lidocaine, 2 cc bupivacaine injected easily Completed without difficulty  Advised to call if fevers/chills, erythema, induration, drainage, or persistent bleeding.  Images permanently stored and available for review in PACS.  Impression: Technically successful ultrasound guided injection.  Independent interpretation of notes and tests performed by another provider:   None.  Brief History, Exam, Impression, and Recommendations:    Primary osteoarthritis of both knees This is a pleasant 75 year old female, 5 years ago I injected her right knee, 1 year ago we injected her left, she was doing well until recently, now having recurrence of pain and swelling in the left knee, pain at the posterior/medial joint line. Moderate effusion, aspiration and injection as above, return to see me as needed.    ___________________________________________ Gwen Her. Dianah Field, M.D., ABFM., CAQSM. Primary Care and [place] Instructor of [employer] [facility]

## 2020-04-24 NOTE — Assessment & Plan Note (Signed)
This is a pleasant 75 year old female, 5 years ago I injected her right knee, 1 year ago we injected her left, she was doing well until recently, now having recurrence of pain and swelling in the left knee, pain at the posterior/medial joint line. Moderate effusion, aspiration and injection as above, return to see me as needed.

## 2020-07-07 ENCOUNTER — Other Ambulatory Visit: Payer: Self-pay

## 2020-07-07 ENCOUNTER — Emergency Department
Admission: EM | Admit: 2020-07-07 | Discharge: 2020-07-07 | Disposition: A | Discharge status: Home / Self Care | Payer: Medicare Other | Source: Home / Self Care

## 2020-07-07 DIAGNOSIS — G933 Postviral fatigue syndrome: Secondary | ICD-10-CM

## 2020-07-07 DIAGNOSIS — R531 Weakness: Secondary | ICD-10-CM

## 2020-07-07 DIAGNOSIS — R5383 Other fatigue: Secondary | ICD-10-CM

## 2020-07-07 DIAGNOSIS — G9331 Postviral fatigue syndrome: Secondary | ICD-10-CM

## 2020-07-07 DIAGNOSIS — R062 Wheezing: Secondary | ICD-10-CM

## 2020-07-07 HISTORY — DX: COVID-19: U07.1

## 2020-07-07 MED ORDER — ALBUTEROL SULFATE HFA 108 (90 BASE) MCG/ACT IN AERS
2.0000 | INHALATION_SPRAY | Freq: Once | RESPIRATORY_TRACT | Status: AC
  Administered 2020-07-07: 2 via RESPIRATORY_TRACT

## 2020-07-07 MED ORDER — PREDNISONE 20 MG PO TABS: 40.0000 mg | ORAL_TABLET | Freq: Every day | ORAL | 0 refills | Status: AC

## 2020-07-07 NOTE — ED Triage Notes (Addendum)
Pt presents to Urgent Care with c/o weakness, tremors, and being "light-headed" over the past several days. Reports recent hx of COVID (May 2022) and states she has not been taking potassium as prescribed. Also reports her "balance is a little off." Pt denies numbness, grips are equal and strong, smile symmetrical and tongue midline. Pt reports diarrhea today, but states she believes it's from drinking so much water/energy drinks.

## 2020-07-07 NOTE — ED Provider Notes (Signed)
[place]    CSN: [id number] Arrival date & time: 07/07/20  1433      History   Chief Complaint Chief Complaint  Patient presents with   Weakness   Dizziness   Tremors   Diarrhea    HPI Claudia Joseph is a 75 y.o. female.   Reports weakness, fatigue, dizziness for the last 2-3 days. Reports that she has recently had Covid on 06/13/20. Has negative hx Covid prior to this. Has completed Covid vaccines. Has not completed flu vaccine. Reports that last week she was not drinking well and that her urine was brown. States that she has been drinking plenty since then and that she has been adding liquid IV to her water. Reports that she feels more hydrated today. Denies headache, abdominal pain, nausea, vomiting, rash, fever, other symptoms.  ROS per HPI  The history is provided by the patient.  Weakness Associated symptoms: diarrhea and dizziness   Dizziness Associated symptoms: diarrhea and weakness   Diarrhea  Past Medical History:  Diagnosis Date   COVID    GERD (gastroesophageal reflux disease)    Head pain, chronic    history of   Hernia    Hypertension     Patient Active Problem List   Diagnosis Date Noted   Bronchiectasis with (acute) exacerbation ([place]) 02/02/2018   Influenza A 02/11/2017   Sore throat 02/11/2017   Primary osteoarthritis of both knees 08/26/2015   Chronic seasonal allergic rhinitis 07/16/2015   Dyspnea 07/16/2015   Right foot pain 06/24/2015   Obesity (BMI 30-39.9) 10/19/2013   Multiple benign melanocytic nevi 08/22/2013   Cough 06/08/2013   Pulmonary nodules 04/27/2011   Hypertension     Past Surgical History:  Procedure Laterality Date   APPENDECTOMY     CHOLECYSTECTOMY     COLONOSCOPY     [facility]     TONSILLECTOMY     TUBAL LIGATION      OB History   No obstetric history on file.      Home Medications    Prior to Admission medications   Medication Sig Start Date End Date Taking? Authorizing Provider   aspirin EC 81 MG tablet Take 81 mg by mouth daily. Swallow whole.   Yes [provider]  potassium chloride (K-DUR,KLOR-CON) 10 MEQ tablet Take 1 tablet by mouth 2 (two) times daily.   Yes [provider]  predniSONE (DELTASONE) 20 MG tablet Take 2 tablets (40 mg total) by mouth daily with breakfast for 5 days. 07/07/20 07/12/20 Yes Faustino Congress, NP  albuterol (PROVENTIL HFA;VENTOLIN HFA) 108 (90 Base) MCG/ACT inhaler Inhale 2 puffs into the lungs every 6 (six) hours as needed for wheezing or shortness of breath. 02/02/18   Martyn Ehrich, NP  amLODipine (NORVASC) 10 MG tablet Take 10 mg by mouth daily.    [provider]  celecoxib (CELEBREX) 200 MG capsule One to 2 tablets by mouth daily as needed for pain. 05/16/19   Silverio Decamp, MD  cholecalciferol (VITAMIN D) 1000 units tablet Take 1,000 Units by mouth daily.    [provider]  metoprolol succinate (TOPROL-XL) 25 MG 24 hr tablet Take 25 mg by mouth daily. 09/02/17   [provider]  RABEprazole (ACIPHEX) 20 MG tablet Take 20 mg by mouth daily as needed. 09/25/17   [provider]    Family History Family History  Problem Relation Age of Onset   Hypertension Mother    Leukemia Father  deceased   Tongue cancer Brother    Pancreatic cancer Maternal Grandmother    Lung disease Neg Hx     Social History Social History   Tobacco Use   Smoking status: Never   Smokeless tobacco: Never  Vaping Use   Vaping Use: Never used  Substance Use Topics   Alcohol use: No   Drug use: No     Allergies   Meperidine hcl and Morphine and related   Review of Systems Review of Systems  Gastrointestinal:  Positive for diarrhea.  Neurological:  Positive for dizziness and weakness.    Physical Exam Triage Vital Signs ED Triage Vitals [07/07/20 1456]  Enc Vitals Group     BP      Pulse      Resp      Temp      Temp src      SpO2      Weight 190 lb (86.2 kg)      Height 5' 7.5" (1.715 m)     Head Circumference      Peak Flow      Pain Score 0     Pain Loc      Pain Edu?      Excl. in [place]?    No data found.  Updated Vital Signs BP 108/73 (BP Location: Right Arm)   Pulse (!) 113   Temp 98.3 F (36.8 C) (Oral)   Resp 20   Ht 5' 7.5" (1.715 m)   Wt 190 lb (86.2 kg)   SpO2 97%   BMI 29.32 kg/m      Physical Exam Vitals and nursing note reviewed.  Constitutional:      General: She is not in acute distress.    Appearance: Normal appearance. She is well-developed.  HENT:     Head: Normocephalic and atraumatic.     Nose: Nose normal.     Mouth/Throat:     Mouth: Mucous membranes are moist.     Pharynx: Oropharynx is clear.  Eyes:     Extraocular Movements: Extraocular movements intact.     Conjunctiva/sclera: Conjunctivae normal.     Pupils: Pupils are equal, round, and reactive to light.  Cardiovascular:     Rate and Rhythm: Normal rate and regular rhythm.     Heart sounds: Normal heart sounds.  Pulmonary:     Effort: Pulmonary effort is normal. No respiratory distress.     Breath sounds: No stridor. Wheezing present. No rhonchi or rales.  Chest:     Chest wall: No tenderness.  Abdominal:     General: Bowel sounds are normal. There is no distension.     Palpations: There is no mass.     Tenderness: There is no abdominal tenderness. There is no right CVA tenderness, left CVA tenderness, guarding or rebound.     Hernia: No hernia is present.  Musculoskeletal:        General: Normal range of motion.     Cervical back: Normal range of motion and neck supple.  Skin:    General: Skin is warm and dry.     Capillary Refill: Capillary refill takes less than 2 seconds.  Neurological:     General: No focal deficit present.     Mental Status: She is alert and oriented to person, place, and time.  Psychiatric:        Mood and Affect: Mood normal.        Behavior: Behavior normal.  Thought Content: Thought content normal.      UC Treatments / Results  Labs (all labs ordered are listed, but only abnormal results are displayed) Labs Reviewed  COMPLETE METABOLIC PANEL WITH GFR - Abnormal; Notable for the following components:      Result Value   Glucose, Bld 103 (*)    ALT 45 (*)    All other components within normal limits  CBC WITH DIFFERENTIAL/PLATELET - Abnormal; Notable for the following components:   HCT 45.7 (*)    All other components within normal limits    EKG   Radiology No results found.  Procedures Procedures (including critical care time)  Medications Ordered in UC Medications  albuterol (VENTOLIN HFA) 108 (90 Base) MCG/ACT inhaler 2 puff (2 puffs Inhalation Given 07/07/20 1515)    Initial Impression / Assessment and Plan / UC Course  I have reviewed the triage vital signs and the nursing notes.  Pertinent labs & imaging results that were available during my care of the patient were reviewed by me and considered in my medical decision making (see chart for details).    Post Viral Syndrome Wheezing Weakness Fatigue  Albuterol inhaler given in office today with improvement of wheezing and patient has more color in her face than on initial exam Continue to hydrate Suspect post viral syndrome since recent Covid infection Could have rebound bronchitis as well Prednisone prescribed as well Will follow up with abnormal labs that require further treatment Follow up with this office or with primary care if symptoms are persisting.  Follow up in the ER for high fever, trouble swallowing, trouble breathing, other concerning symptoms.   Final Clinical Impressions(s) / UC Diagnoses   Final diagnoses:  Wheezing  Weakness  Other fatigue  Post viral syndrome     Discharge Instructions      We have given you an albuterol inhaler for you to use 2 puffs every 4-6 hours as needed for cough, shortness of breath, wheezing.  I have sent in prednisone for you to take 2 tablets  daily in the morning for 5 days  We have drawn some labs, I will be in touch with any abnormal labs  Follow up with this office or with primary care if symptoms are persisting.  Follow up in the ER for high fever, trouble swallowing, trouble breathing, other concerning symptoms.      ED Prescriptions     Medication Sig Dispense Auth. Provider   predniSONE (DELTASONE) 20 MG tablet Take 2 tablets (40 mg total) by mouth daily with breakfast for 5 days. 10 tablet Faustino Congress, NP      PDMP not reviewed this encounter.   Faustino Congress, NP 07/08/20 1245

## 2020-07-07 NOTE — Discharge Instructions (Addendum)
We have given you an albuterol inhaler for you to use 2 puffs every 4-6 hours as needed for cough, shortness of breath, wheezing.  I have sent in prednisone for you to take 2 tablets daily in the morning for 5 days  We have drawn some labs, I will be in touch with any abnormal labs  Follow up with this office or with primary care if symptoms are persisting.  Follow up in the ER for high fever, trouble swallowing, trouble breathing, other concerning symptoms.

## 2020-07-08 ENCOUNTER — Telehealth: Payer: Self-pay

## 2020-07-08 ENCOUNTER — Telehealth: Payer: Self-pay | Admitting: Emergency Medicine

## 2020-07-08 LAB — COMPLETE METABOLIC PANEL WITH GFR
AG Ratio: 1.4 (calc) (ref 1.0–2.5)
ALT: 45 U/L — ABNORMAL HIGH (ref 6–29)
AST: 26 U/L (ref 10–35)
Albumin: 4.1 g/dL (ref 3.6–5.1)
Alkaline phosphatase (APISO): 84 U/L (ref 37–153)
BUN: 12 mg/dL (ref 7–25)
CO2: 20 mmol/L (ref 20–32)
Calcium: 9.6 mg/dL (ref 8.6–10.4)
Chloride: 106 mmol/L (ref 98–110)
Creat: 0.93 mg/dL (ref 0.60–0.93)
GFR, Est African American: 69 mL/min/{1.73_m2} (ref >=60)
GFR, Est Non African American: 60 mL/min/{1.73_m2} (ref >=60)
Globulin: 2.9 g/dL (calc) (ref 1.9–3.7)
Glucose, Bld: 103 mg/dL — ABNORMAL HIGH (ref 65–99)
Potassium: 3.6 mmol/L (ref 3.5–5.3)
Sodium: 139 mmol/L (ref 135–146)
Total Bilirubin: 0.4 mg/dL (ref 0.2–1.2)
Total Protein: 7 g/dL (ref 6.1–8.1)

## 2020-07-08 LAB — CBC WITH DIFFERENTIAL/PLATELET
Absolute Monocytes: 609 cells/uL (ref 200–950)
Basophils Absolute: 42 cells/uL (ref 0–200)
Basophils Relative: 0.4 %
Eosinophils Absolute: 347 cells/uL (ref 15–500)
Eosinophils Relative: 3.3 %
HCT: 45.7 % — ABNORMAL HIGH (ref 35.0–45.0)
Hemoglobin: 14.9 g/dL (ref 11.7–15.5)
Lymphs Abs: 2814 cells/uL (ref 850–3900)
MCH: 30.4 pg (ref 27.0–33.0)
MCHC: 32.6 g/dL (ref 32.0–36.0)
MCV: 93.3 fL (ref 80.0–100.0)
MPV: 10.6 fL (ref 7.5–12.5)
Monocytes Relative: 5.8 %
Neutro Abs: 6689 cells/uL (ref 1500–7800)
Neutrophils Relative %: 63.7 %
Platelets: 245 10*3/uL (ref 140–400)
RBC: 4.9 10*6/uL (ref 3.80–5.10)
RDW: 12.8 % (ref 11.0–15.0)
Total Lymphocyte: 26.8 %
WBC: 10.5 10*3/uL (ref 3.8–10.8)

## 2020-07-08 NOTE — Telephone Encounter (Signed)
Called about lab results. Informed results still pending but will show up in MyChart as soon as they are processed.

## 2020-07-08 NOTE — Telephone Encounter (Signed)
Call from [place] regarding her labs - 2 identifiers confirmed - Claudia Joseph reviewed with pt. RN explained the slightly elevated labs were still within the normal range. Pt to be seen by her pulmonologist next week. [place] states she feels much better and thanked Claudia Joseph for her care and lab result explanation

## 2020-07-10 ENCOUNTER — Telehealth: Payer: Self-pay

## 2020-07-10 NOTE — Telephone Encounter (Signed)
Approx 1130 Pt calls [facility] to report that she is still having tremors, weakness, and light-headedness. Denies other s/s. Reports that her lab work from 07/07/20 may indicate that she is dehydrated. Assessed labs and reviewed--notified that electrolytes are all WNL. Encouraged her to hydrate and to see PCP or other medical provider if her symptoms persist. Pt reports she is going to the beach tomorrow. If symptoms persist, encouraged her to get reevaluated before driving/traveling a distance from home. She verbalizes understanding.

## 2020-07-24 ENCOUNTER — Ambulatory Visit: Payer: Medicare Other

## 2020-07-24 ENCOUNTER — Other Ambulatory Visit: Payer: Self-pay

## 2020-07-24 ENCOUNTER — Ambulatory Visit: Payer: Medicare Other | Admitting: Adult Health

## 2020-07-24 ENCOUNTER — Encounter: Payer: Self-pay | Admitting: Adult Health

## 2020-07-24 VITALS — BP 142/90 | HR 94 | Temp 98.2°F | Ht 67.5 in | Wt 196.4 lb

## 2020-07-24 DIAGNOSIS — R0600 Dyspnea, unspecified: Secondary | ICD-10-CM | POA: Diagnosis not present

## 2020-07-24 DIAGNOSIS — R0609 Other forms of dyspnea: Secondary | ICD-10-CM

## 2020-07-24 DIAGNOSIS — U071 COVID-19: Secondary | ICD-10-CM | POA: Diagnosis not present

## 2020-07-24 DIAGNOSIS — R059 Cough, unspecified: Secondary | ICD-10-CM

## 2020-07-24 DIAGNOSIS — J302 Other seasonal allergic rhinitis: Secondary | ICD-10-CM | POA: Diagnosis not present

## 2020-07-24 MED ORDER — FLUTICASONE FUROATE-VILANTEROL 100-25 MCG/INH IN AEPB: 1.0000 | INHALATION_SPRAY | Freq: Every day | RESPIRATORY_TRACT | 0 refills | Status: DC

## 2020-07-24 NOTE — Progress Notes (Signed)
@Patient  ID: Claudia Joseph, female    DOB: 06-29-1945, 75 y.o.   MRN: [id number]  Chief Complaint  Patient presents with   Follow-up    Referring provider: No ref. 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 3 mm left upper lobe nodule unchanged. No new nodule. No axillary or supraclavicular lymphadenopathy. No mediastinal or hilar adenopathy. Nodule stable going back to 10/08/11   High-resolution CT chest October 2019 showed mild bronchiectasis most evident in the right lower and right middle lobes.  Several tiny 2 to 4 mm nodules throughout stable since 2015   LABS 07/18/15 CBC: 8.1/14.3/42.3/321 Eosinophils: 0.2 IgE: 18 RAST panel: Negative   04/27/11 IgE: 12.8 Hypersensitivity Pneumonitis Panel:  Aspergillus pullulans 1 band detected  07/24/2020 Follow up : Covid 19 follow up  Patient presents for a follow up. She tested positive for COVID 19 06/12/20, Was vaccinated. Had cold like symptoms . Cough, drainage, fatigue , low energy and activity tolerance. Seen in urgent care , She was diagnosed with post viral syndrome , recommended supportive care and prednisone taper. Complains of brain fog and poor concentration.         Is feeling some better but symptoms seem to be lingering with cough and fatigue .  Chest xray 6/6//22 was clear . Marland Kitchen                                                    Allergies  Allergen Reactions   Meperidine Hcl     Demerol causes severe  HAs   Morphine And Related     Causes bad headaches per patient    Immunization History  Administered Date(s) Administered   PPD Test 08/13/2014, 10/20/2016    Past Medical History:  Diagnosis Date   COVID    GERD (gastroesophageal reflux disease)    Head pain, chronic    history of   Hernia    Hypertension     Tobacco History: Social History   Tobacco Use  Smoking Status Never  Smokeless Tobacco Never   Counseling given: Not Answered   Outpatient Medications Prior to Visit  Medication Sig Dispense Refill   albuterol (PROVENTIL HFA;VENTOLIN HFA) 108 (90 Base) MCG/ACT inhaler Inhale 2 puffs into the lungs every 6 (six) hours as needed for wheezing or shortness of breath. 1 Inhaler 6   amLODipine (NORVASC) 10 MG tablet Take 10 mg by mouth daily.     aspirin EC 81 MG tablet Take 81 mg by mouth daily. Swallow whole.     cholecalciferol (VITAMIN D) 1000 units tablet Take 1,000 Units by mouth daily.     metoprolol succinate (TOPROL-XL) 25 MG 24 hr tablet Take 25 mg by mouth daily.  0   potassium chloride (K-DUR,KLOR-CON) 10 MEQ tablet Take 1 tablet by mouth 2 (two) times daily.  RABEprazole (ACIPHEX) 20 MG tablet Take 20 mg by mouth daily as needed.  3   celecoxib (CELEBREX) 200 MG capsule One to 2 tablets by mouth daily as needed for pain. (Patient not taking: Reported on 07/24/2020) 60 capsule 2   No facility-administered medications prior to visit.     Review of Systems:   Constitutional:   No  weight loss, night sweats,  Fevers, chills, fatigue, or  lassitude.  HEENT:   No headaches,  Difficulty swallowing,  Tooth/dental problems, or  Sore throat,                No sneezing, itching, ear ache, +nasal congestion, post nasal drip,   CV:  No chest pain,  Orthopnea, PND, swelling in lower extremities, anasarca, dizziness, palpitations, syncope.   GI  No heartburn, indigestion, abdominal pain, nausea, vomiting, diarrhea, change in bowel habits, loss of appetite, bloody  stools.   Resp:  No excess mucus, no productive cough,  No non-productive cough,  No coughing up of blood.  No change in color of mucus.  No wheezing.  No chest wall deformity  Skin: no rash or lesions.  GU: no dysuria, change in color of urine, no urgency or frequency.  No flank pain, no hematuria   MS:  No joint pain or swelling.  No decreased range of motion.  No back pain.    Physical Exam  BP (!) 142/90 (BP Location: Left Arm, Cuff Size: Large)   Pulse 94   SpO2 95%   GEN: A/Ox3; pleasant , NAD, well nourished    HEENT:  [place]/AT,   NOSE-clear, THROAT-clear, no lesions, no postnasal drip or exudate noted.   NECK:  Supple w/ fair ROM; no JVD; normal carotid impulses w/o bruits; no thyromegaly or nodules palpated; no lymphadenopathy.    RESP  Clear  P & A; w/o, wheezes/ rales/ or rhonchi. no accessory muscle use, no dullness to percussion  CARD:  RRR, no m/r/g, no peripheral edema, pulses intact, no cyanosis or clubbing.  GI:   Soft & nt; nml bowel sounds; no organomegaly or masses detected.   Musco: Warm bil, no deformities or joint swelling noted.   Neuro: alert, no focal deficits noted.    Skin: Warm, no lesions or rashes    Lab Results:  CBC    Component Value Date/Time   WBC 10.5 07/07/2020 1559   RBC 4.90 07/07/2020 1559   HGB 14.9 07/07/2020 1559   HCT 45.7 (H) 07/07/2020 1559   PLT 245 07/07/2020 1559   MCV 93.3 07/07/2020 1559   MCH 30.4 07/07/2020 1559   MCHC 32.6 07/07/2020 1559   RDW 12.8 07/07/2020 1559   LYMPHSABS 2,814 07/07/2020 1559   MONOABS 0.5 07/18/2015 1019   EOSABS 347 07/07/2020 1559   BASOSABS 42 07/07/2020 1559    BMET    Component Value Date/Time   NA 139 07/07/2020 1559   K 3.6 07/07/2020 1559   CL 106 07/07/2020 1559   CO2 20 07/07/2020 1559   GLUCOSE 103 (H) 07/07/2020 1559   BUN 12 07/07/2020 1559   CREATININE 0.93 07/07/2020 1559   CALCIUM 9.6 07/07/2020 1559   GFRNONAA 60 07/07/2020 1559   GFRAA 69 07/07/2020 1559     BNP No results found for: BNP  ProBNP No results found for: PROBNP  Imaging: No results found.    PFT Results Latest Ref Rng & Units 05/14/2016  FVC-Pre L 2.92  FVC-Predicted Pre % 89  FVC-Post L 2.83  FVC-Predicted  Post % 86  Pre FEV1/FVC % % 83  Post FEV1/FCV % % 89  FEV1-Pre L 2.43  FEV1-Predicted Pre % 98  FEV1-Post L 2.52  DLCO uncorrected ml/min/mmHg 22.61  DLCO UNC% % 79  DLCO corrected ml/min/mmHg 21.57  DLCO COR %Predicted % 76  DLVA Predicted % 94  TLC L 4.85  TLC % Predicted % 88  RV % Predicted % 83    No results found for: NITRICOXIDE      Assessment & Plan:   No problem-specific Assessment & Plan notes found for this encounter.     Rexene Edison, NP 07/24/2020

## 2020-07-24 NOTE — Patient Instructions (Signed)
BREO 1 puff daily until sample is gone , rinse after use .  Albuterol inhaler 1-2 puffs every 4hrs as needed.  Saline nasal rinses As needed   Flonase 2 puffs daily for 1 week , then As needed   Zyrtec 10mg  daily  Deslym 2 tsp Twice daily  As needed  Cough .  Follow with Dr. Valeta Harms or Deshundra Waller NP in 6-8 weeks and As needed

## 2020-07-27 DIAGNOSIS — U071 COVID-19: Secondary | ICD-10-CM | POA: Insufficient documentation

## 2020-07-27 NOTE — Assessment & Plan Note (Signed)
Post viral cough-RAD (covid 19 05/2020)  Chest xray 06/24/20 care everywhere -neg  Sample of ICS/LABA x 1 wk   Plan Patient Instructions  BREO 1 puff daily until sample is gone , rinse after use .  Albuterol inhaler 1-2 puffs every 4hrs as needed.  Saline nasal rinses As needed   Flonase 2 puffs daily for 1 week , then As needed   Zyrtec 10mg  daily  Deslym 2 tsp Twice daily  As needed  Cough .  Follow with Dr. Valeta Harms or Lakeesha Fontanilla NP in 6-8 weeks and As needed

## 2020-07-27 NOTE — Assessment & Plan Note (Signed)
Continue on current regimen .   

## 2020-07-27 NOTE — Assessment & Plan Note (Signed)
Ongoing covid 19 symptoms - fatigue , brain fog, cough .  Supportive care , fluids and rest   Plan  Patient Instructions  BREO 1 puff daily until sample is gone , rinse after use .  Albuterol inhaler 1-2 puffs every 4hrs as needed.  Saline nasal rinses As needed   Flonase 2 puffs daily for 1 week , then As needed   Zyrtec 10mg  daily  Deslym 2 tsp Twice daily  As needed  Cough .  Follow with Dr. Valeta Harms or Yeraldy Spike NP in 6-8 weeks and As needed

## 2020-09-12 ENCOUNTER — Encounter: Payer: Self-pay | Admitting: Adult Health

## 2020-09-24 ENCOUNTER — Ambulatory Visit: Payer: Medicare Other | Admitting: Adult Health

## 2020-09-26 ENCOUNTER — Ambulatory Visit: Payer: Medicare Other | Admitting: Adult Health

## 2020-09-26 ENCOUNTER — Other Ambulatory Visit: Payer: Self-pay

## 2020-09-26 ENCOUNTER — Encounter: Payer: Self-pay | Admitting: Adult Health

## 2020-09-26 DIAGNOSIS — U099 Post covid-19 condition, unspecified: Secondary | ICD-10-CM | POA: Insufficient documentation

## 2020-09-26 DIAGNOSIS — J479 Bronchiectasis, uncomplicated: Secondary | ICD-10-CM | POA: Diagnosis not present

## 2020-09-26 DIAGNOSIS — J302 Other seasonal allergic rhinitis: Secondary | ICD-10-CM | POA: Diagnosis not present

## 2020-09-26 MED ORDER — FLUTICASONE FUROATE-VILANTEROL 100-25 MCG/INH IN AEPB: 1.0000 | INHALATION_SPRAY | Freq: Every day | RESPIRATORY_TRACT | 0 refills | Status: DC

## 2020-09-26 NOTE — Assessment & Plan Note (Signed)
Add Chlor-Trimeton 4 mg at bedtime As needed    Plan  Patient Instructions  BREO 1 puff daily until sample is gone , rinse after use .  Albuterol inhaler 1-2 puffs every 4hrs as needed.  Saline nasal rinses As needed   Flonase 2 puffs daily for 1 week , then As needed   Zyrtec '10mg'$  daily As needed  drainage  May try Chlorpheniramine '4mg'$  daily at bedtime as needed. (Chlor tabs) Deslym 2 tsp Twice daily  As needed  Cough .  Follow with Dr. Valeta Harms or Racheal Mathurin NP in 4 -6 months  and As needed

## 2020-09-26 NOTE — Assessment & Plan Note (Signed)
Patient has had lingering symptoms of fatigue low energy poor concentration brain fog hair loss since having COVID-19 in May. Symptoms seem to be improving slowly.  Patient is continue with supportive care. Plan  Patient Instructions  BREO 1 puff daily until sample is gone , rinse after use .  Albuterol inhaler 1-2 puffs every 4hrs as needed.  Saline nasal rinses As needed   Flonase 2 puffs daily for 1 week , then As needed   Zyrtec '10mg'$  daily As needed  drainage  May try Chlorpheniramine '4mg'$  daily at bedtime as needed. (Chlor tabs) Deslym 2 tsp Twice daily  As needed  Cough .  Follow with Dr. Valeta Harms or Lori-Ann Lindfors NP in 4 -6 months  and As needed

## 2020-09-26 NOTE — Patient Instructions (Addendum)
BREO 1 puff daily until sample is gone , rinse after use .  Albuterol inhaler 1-2 puffs every 4hrs as needed.  Saline nasal rinses As needed   Flonase 2 puffs daily for 1 week , then As needed   Zyrtec '10mg'$  daily As needed  drainage  May try Chlorpheniramine '4mg'$  daily at bedtime as needed. (Chlor tabs) Deslym 2 tsp Twice daily  As needed  Cough .  Follow with Dr. Valeta Harms or Taurus Alamo NP in 4 -6 months  and As needed

## 2020-09-26 NOTE — Assessment & Plan Note (Signed)
Currently stable.  Continue on current regimen.  Plan  Patient Instructions  BREO 1 puff daily until sample is gone , rinse after use .  Albuterol inhaler 1-2 puffs every 4hrs as needed.  Saline nasal rinses As needed   Flonase 2 puffs daily for 1 week , then As needed   Zyrtec '10mg'$  daily As needed  drainage  May try Chlorpheniramine '4mg'$  daily at bedtime as needed. (Chlor tabs) Deslym 2 tsp Twice daily  As needed  Cough .  Follow with Dr. Valeta Harms or Anagha Loseke NP in 4 -6 months  and As needed

## 2020-09-26 NOTE — Progress Notes (Signed)
$'@Patient'e$  ID: Claudia Joseph, female    DOB: 1945/03/15, 75 y.o.   MRN: [id number]  Chief Complaint  Patient presents with   Follow-up    Referring provider: No ref. provider found  HPI: 75 year old female never smoker followed for allergic rhinitis, pulmonary nodules and mild bronchiectasis Hypersensitivity pneumonitis in 2013 due to mold exposure in home  TEST/EVENTS :  05/14/16: FVC 2.92 L (89%) FEV1 2.43 L (98%) FEV1/FVC 0.83 FEF 25-75 2.88 L (140%) negative bronchodilator response TLC 4.85 L (88%) RV 83% ERV 138% DLCO corrected 76% 04/27/11: FVC 2.98 L (87%) FEV1 2.47 L (94%) FEV1/FVC 0.83 FEF 25-75 2.79 L (124%)   IMAGING CT CHEST W/O 06/08/13 (per radiologist): Two 4 mm nodules left lower lobe unchanged from prior CT imaging. 3 mm left upper lobe nodule unchanged. No new nodule. No axillary or supraclavicular lymphadenopathy. No mediastinal or hilar adenopathy. Nodule stable going back to 10/08/11   High-resolution CT chest October 2019 showed mild bronchiectasis most evident in the right lower and right middle lobes.  Several tiny 2 to 4 mm nodules throughout stable since 2015   LABS 07/18/15 CBC: 8.1/14.3/42.3/321 Eosinophils: 0.2 IgE: 18 RAST panel: Negative   04/27/11 IgE: 12.8 Hypersensitivity Pneumonitis Panel:  Aspergillus pullulans 1 band detected  09/26/2020 Follow up : Bronchiectasis and allergic rhinitis Patient returns for a 15-monthfollow-up.  Patient tested positive for COVID-19 in May 2022.  She had cold-like symptoms with cough drainage and fatigue.  She had some long-haul COVID symptoms with ongoing decreased energy activity tolerance and fatigue.  Also had poor concentration and brain fog.  Since last visit patient says the symptoms are starting to slowly improve.  Her chest x-ray was checked June 24, 2020 and showed clear lungs.  Patient says overall her breathing is doing good.  She has no increased cough or congestion.  Does have some postnasal drainage that is  worse at night.  She has been taking Zyrtec but is noticing that with the fall time her allergies have been getting worse.  She remains on Breo daily.  Denies any increased albuterol use. Declines flu shot.  Allergies  Allergen Reactions   Meperidine Hcl     Demerol causes severe HAs   Morphine And Related     Causes bad headaches per patient    Immunization History  Administered Date(s) Administered   PPD Test 08/13/2014, 10/20/2016    Past Medical History:  Diagnosis Date   COVID    GERD (gastroesophageal reflux disease)    Head pain, chronic    history of   Hernia    Hypertension     Tobacco History: Social History   Tobacco Use  Smoking Status Never  Smokeless Tobacco Never   Counseling given: Not Answered   Outpatient Medications Prior to Visit  Medication Sig Dispense Refill   albuterol (PROVENTIL HFA;VENTOLIN HFA) 108 (90 Base) MCG/ACT inhaler Inhale 2 puffs into the lungs every 6 (six) hours as needed for wheezing or shortness of breath. 1 Inhaler 6   amLODipine (NORVASC) 10 MG tablet Take 10 mg by mouth daily.     aspirin EC 81 MG tablet Take 81 mg by mouth daily. Swallow whole.     cholecalciferol (VITAMIN D) 1000 units tablet Take 1,000 Units by mouth daily.     fluticasone furoate-vilanterol (BREO ELLIPTA) 100-25 MCG/INH AEPB Inhale 1 puff into the lungs daily. 1 each 0   metoprolol succinate (TOPROL-XL) 25 MG 24 hr tablet Take 25 mg by mouth  daily.  0   potassium chloride (K-DUR,KLOR-CON) 10 MEQ tablet Take 1 tablet by mouth 2 (two) times daily.     RABEprazole (ACIPHEX) 20 MG tablet Take 20 mg by mouth daily as needed.  3   No facility-administered medications prior to visit.     Review of Systems:   Constitutional:   No  weight loss, night sweats,  Fevers, chills, fatigue, or  lassitude.  HEENT:   No headaches,  Difficulty swallowing,  Tooth/dental problems, or  Sore throat,                No sneezing, itching, ear ache,  +nasal congestion, post  nasal drip,   CV:  No chest pain,  Orthopnea, PND, swelling in lower extremities, anasarca, dizziness, palpitations, syncope.   GI  No heartburn, indigestion, abdominal pain, nausea, vomiting, diarrhea, change in bowel habits, loss of appetite, bloody stools.   Resp: No shortness of breath with exertion or at rest.  No excess mucus, no productive cough,  No non-productive cough,  No coughing up of blood.  No change in color of mucus.  No wheezing.  No chest wall deformity  Skin: no rash or lesions.  GU: no dysuria, change in color of urine, no urgency or frequency.  No flank pain, no hematuria   MS:  No joint pain or swelling.  No decreased range of motion.  No back pain.    Physical Exam  BP 140/70 (BP Location: Left Arm, Patient Position: Sitting, Cuff Size: Normal)   Pulse 76   Temp 98.3 F (36.8 C) (Oral)   Ht '5\' 8"'$  (1.727 m)   Wt 195 lb 3.2 oz (88.5 kg)   SpO2 99%   BMI 29.68 kg/m   GEN: A/Ox3; pleasant , NAD, well nourished    HEENT:  [place]/AT,  NOSE-clear, THROAT-clear, no lesions, no postnasal drip or exudate noted.   NECK:  Supple w/ fair ROM; no JVD; normal carotid impulses w/o bruits; no thyromegaly or nodules palpated; no lymphadenopathy.    RESP  Clear  P & A; w/o, wheezes/ rales/ or rhonchi. no accessory muscle use, no dullness to percussion  CARD:  RRR, no m/r/g, no peripheral edema, pulses intact, no cyanosis or clubbing.  GI:   Soft & nt; nml bowel sounds; no organomegaly or masses detected.   Musco: Warm bil, no deformities or joint swelling noted.   Neuro: alert, no focal deficits noted.    Skin: Warm, no lesions or rashes    Lab Results:  CBC    Component Value Date/Time   WBC 10.5 07/07/2020 1559   RBC 4.90 07/07/2020 1559   HGB 14.9 07/07/2020 1559   HCT 45.7 (H) 07/07/2020 1559   PLT 245 07/07/2020 1559   MCV 93.3 07/07/2020 1559   MCH 30.4 07/07/2020 1559   MCHC 32.6 07/07/2020 1559   RDW 12.8 07/07/2020 1559   LYMPHSABS 2,814  07/07/2020 1559   MONOABS 0.5 07/18/2015 1019   EOSABS 347 07/07/2020 1559   BASOSABS 42 07/07/2020 1559    BMET    Component Value Date/Time   NA 139 07/07/2020 1559   K 3.6 07/07/2020 1559   CL 106 07/07/2020 1559   CO2 20 07/07/2020 1559   GLUCOSE 103 (H) 07/07/2020 1559   BUN 12 07/07/2020 1559   CREATININE 0.93 07/07/2020 1559   CALCIUM 9.6 07/07/2020 1559   GFRNONAA 60 07/07/2020 1559   GFRAA 69 07/07/2020 1559    BNP No results found for: BNP  ProBNP No results found for: PROBNP  Imaging: No results found.    PFT Results Latest Ref Rng & Units 05/14/2016  FVC-Pre L 2.92  FVC-Predicted Pre % 89  FVC-Post L 2.83  FVC-Predicted Post % 86  Pre FEV1/FVC % % 83  Post FEV1/FCV % % 89  FEV1-Pre L 2.43  FEV1-Predicted Pre % 98  FEV1-Post L 2.52  DLCO uncorrected ml/min/mmHg 22.61  DLCO UNC% % 79  DLCO corrected ml/min/mmHg 21.57  DLCO COR %Predicted % 76  DLVA Predicted % 94  TLC L 4.85  TLC % Predicted % 88  RV % Predicted % 83    No results found for: NITRICOXIDE      Assessment & Plan:   Chronic seasonal allergic rhinitis Add Chlor-Trimeton 4 mg at bedtime As needed    Plan  Patient Instructions  BREO 1 puff daily until sample is gone , rinse after use .  Albuterol inhaler 1-2 puffs every 4hrs as needed.  Saline nasal rinses As needed   Flonase 2 puffs daily for 1 week , then As needed   Zyrtec '10mg'$  daily As needed  drainage  May try Chlorpheniramine '4mg'$  daily at bedtime as needed. (Chlor tabs) Deslym 2 tsp Twice daily  As needed  Cough .  Follow with Dr. Valeta Harms or Kohle Winner NP in 4 -6 months  and As needed          Bronchiectasis without complication ([place]) Currently stable.  Continue on current regimen.  Plan  Patient Instructions  BREO 1 puff daily until sample is gone , rinse after use .  Albuterol inhaler 1-2 puffs every 4hrs as needed.  Saline nasal rinses As needed   Flonase 2 puffs daily for 1 week , then As needed   Zyrtec  '10mg'$  daily As needed  drainage  May try Chlorpheniramine '4mg'$  daily at bedtime as needed. (Chlor tabs) Deslym 2 tsp Twice daily  As needed  Cough .  Follow with Dr. Valeta Harms or Lauralye Kinn NP in 4 -6 months  and As needed         COVID-19 long hauler Patient has had lingering symptoms of fatigue low energy poor concentration brain fog hair loss since having COVID-19 in May. Symptoms seem to be improving slowly.  Patient is continue with supportive care. Plan  Patient Instructions  BREO 1 puff daily until sample is gone , rinse after use .  Albuterol inhaler 1-2 puffs every 4hrs as needed.  Saline nasal rinses As needed   Flonase 2 puffs daily for 1 week , then As needed   Zyrtec '10mg'$  daily As needed  drainage  May try Chlorpheniramine '4mg'$  daily at bedtime as needed. (Chlor tabs) Deslym 2 tsp Twice daily  As needed  Cough .  Follow with Dr. Valeta Harms or Ameenah Prosser NP in 4 -6 months  and As needed           Rexene Edison, NP 09/26/2020

## 2021-01-30 ENCOUNTER — Ambulatory Visit: Payer: Medicare Other | Admitting: Adult Health

## 2021-01-30 DIAGNOSIS — H21233 Degeneration of iris (pigmentary), bilateral: Secondary | ICD-10-CM | POA: Diagnosis not present

## 2021-02-04 ENCOUNTER — Encounter: Payer: Self-pay | Admitting: Adult Health

## 2021-02-04 ENCOUNTER — Ambulatory Visit: Payer: Medicare Other | Admitting: Adult Health

## 2021-02-04 ENCOUNTER — Other Ambulatory Visit: Payer: Self-pay

## 2021-02-04 DIAGNOSIS — U099 Post covid-19 condition, unspecified: Secondary | ICD-10-CM

## 2021-02-04 DIAGNOSIS — J302 Other seasonal allergic rhinitis: Secondary | ICD-10-CM | POA: Diagnosis not present

## 2021-02-04 DIAGNOSIS — J479 Bronchiectasis, uncomplicated: Secondary | ICD-10-CM

## 2021-02-04 NOTE — Progress Notes (Signed)
@Patient  ID: Claudia Joseph, female    DOB: [birth year], 76 y.o.   MRN: [id number]  Chief Complaint  Patient presents with   Follow-up    Referring provider: No ref. 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   TEST/EVENTS :  05/14/16: FVC 2.92 L (89%) FEV1 2.43 L (98%) FEV1/FVC 0.83 FEF 25-75 2.88 L (140%) negative bronchodilator response TLC 4.85 L (88%) RV 83% ERV 138% DLCO corrected 76% 04/27/11: FVC 2.98 L (87%) FEV1 2.47 L (94%) FEV1/FVC 0.83 FEF 25-75 2.79 L (124%)   IMAGING CT CHEST W/O 06/08/13 (per radiologist): Two 4 mm nodules left lower lobe unchanged from prior CT imaging. 3 mm left upper lobe nodule unchanged. No new nodule. No axillary or supraclavicular lymphadenopathy. No mediastinal or hilar adenopathy. Nodule stable going back to 10/08/11   High-resolution CT chest October 2019 showed mild bronchiectasis most evident in the right lower and right middle lobes.  Several tiny 2 to 4 mm nodules throughout stable since 2015   LABS 07/18/15 CBC: 8.1/14.3/42.3/321 Eosinophils: 0.2 IgE: 18 RAST panel: Negative   04/27/11 IgE: 12.8 Hypersensitivity Pneumonitis Panel:  Aspergillus pullulans 1 band detected  02/04/2021 Follow up : Bronchiectasis and allergic rhinitis Patient presents for a 42-month follow-up.  Patient has underlying bronchiectasis and allergic rhinitis.  She also had COVID-19 infection in May 2022.  She has some long-haul COVID symptoms with decreased activity tolerance and fatigue.  Also some poor concentration and brain fog.  Patient says she is slowly starting to improve.  Chest x-ray June 2022 showed clear lungs. She remains on Breo daily.  She denies any increased albuterol use.  No flare of cough or wheezing. Has chronic allergies with nasal congestion.   She  uses Flonase and Zyrtec as needed.  Occasional use chlor tabs 4 mg at bedtime if needed. Trying to stay active. Wants to start walking when weather improves.  Using chi machine that improves circulation all over.   Declines vaccine. Patient education given.      Allergies  Allergen Reactions   Meperidine Hcl     Demerol causes severe HAs   Morphine And Related     Causes bad headaches per patient    Immunization History  Administered Date(s) Administered   PPD Test 08/13/2014, 10/20/2016    Past Medical History:  Diagnosis Date   COVID    GERD (gastroesophageal reflux disease)    Head pain, chronic    history of   Hernia    Hypertension     Tobacco History: Social History   Tobacco Use  Smoking Status Never  Smokeless Tobacco Never   Counseling given: Not Answered   Outpatient Medications Prior to Visit  Medication Sig Dispense Refill   albuterol (PROVENTIL HFA;VENTOLIN HFA) 108 (90 Base) MCG/ACT inhaler Inhale 2 puffs into the lungs every 6 (six) hours as needed for wheezing or shortness of breath. 1 Inhaler 6   amLODipine (NORVASC) 10 MG tablet Take 10 mg by mouth daily.     aspirin EC 81 MG tablet Take 81 mg by mouth daily. Swallow whole.     cholecalciferol (VITAMIN D) 1000 units tablet Take 1,000 Units by mouth daily.     fluticasone furoate-vilanterol (BREO ELLIPTA) 100-25 MCG/INH AEPB Inhale 1 puff into the lungs daily. 1 each 0   metoprolol succinate (TOPROL-XL)  25 MG 24 hr tablet Take 25 mg by mouth daily.  0   potassium chloride (K-DUR,KLOR-CON) 10 MEQ tablet Take 1 tablet by mouth 2 (two) times daily.     RABEprazole (ACIPHEX) 20 MG tablet Take 20 mg by mouth daily as needed.  3   fluticasone furoate-vilanterol (BREO ELLIPTA) 100-25 MCG/INH AEPB Inhale 1 puff into the lungs daily. (Patient not taking: Reported on 02/04/2021) 1 each 0   No facility-administered medications prior to visit.     Review of Systems:   Constitutional:   No  weight loss,  night sweats,  Fevers, chills,  +fatigue, or  lassitude.  HEENT:   No headaches,  Difficulty swallowing,  Tooth/dental problems, or  Sore throat,                No sneezing, itching, ear ache,  +nasal congestion, post nasal drip,   CV:  No chest pain,  Orthopnea, PND, swelling in lower extremities, anasarca, dizziness, palpitations, syncope.   GI  No heartburn, indigestion, abdominal pain, nausea, vomiting, diarrhea, change in bowel habits, loss of appetite, bloody stools.   Resp:   No chest wall deformity  Skin: no rash or lesions.  GU: no dysuria, change in color of urine, no urgency or frequency.  No flank pain, no hematuria   MS:  No joint pain or swelling.  No decreased range of motion.  No back pain.    Physical Exam  BP 110/76 (BP Location: Left Arm, Patient Position: Sitting, Cuff Size: Large)    Pulse 78    Temp 97.9 F (36.6 C) (Oral)    Ht 5\' 7"  (1.702 m)    Wt 188 lb 12.8 oz (85.6 kg)    SpO2 96%    BMI 29.57 kg/m   GEN: A/Ox3; pleasant , NAD, well nourished    HEENT:  [place]/AT,    no lesions, no postnasal drip or exudate noted.   NECK:  Supple w/ fair ROM; no JVD; normal carotid impulses w/o bruits; no thyromegaly or nodules palpated; no lymphadenopathy.    RESP  Clear  P & A; w/o, wheezes/ rales/ or rhonchi. no accessory muscle use, no dullness to percussion  CARD:  RRR, no m/r/g, no peripheral edema, pulses intact, no cyanosis or clubbing.  GI:   Soft & nt; nml bowel sounds; no organomegaly or masses detected.   Musco: Warm bil, no deformities or joint swelling noted.   Neuro: alert, no focal deficits noted.    Skin: Warm, no lesions or rashes    Lab Results:    BMET   BNP No results found for: BNP  ProBNP No results found for: PROBNP  Imaging: No results found.    PFT Results Latest Ref Rng & Units 05/14/2016  FVC-Pre L 2.92  FVC-Predicted Pre % 89  FVC-Post L 2.83  FVC-Predicted Post % 86  Pre FEV1/FVC % % 83  Post FEV1/FCV % % 89   FEV1-Pre L 2.43  FEV1-Predicted Pre % 98  FEV1-Post L 2.52  DLCO uncorrected ml/min/mmHg 22.61  DLCO UNC% % 79  DLCO corrected ml/min/mmHg 21.57  DLCO COR %Predicted % 76  DLVA Predicted % 94  TLC L 4.85  TLC % Predicted % 88  RV % Predicted % 83    No results found for: NITRICOXIDE      Assessment & Plan:   Chronic seasonal allergic rhinitis Controlled on present regimen ,  Add saline nasal spray and gel .As needed    Plan  Patient Instructions  BREO 1 puff daily  , rinse after use .  Albuterol inhaler 1-2 puffs every 4hrs as needed.  Saline nasal rinses As needed   Saline nasal gel At bedtime   Flonase 2 puffs daily for 1 week , then As needed   Zyrtec 10mg  daily As needed  drainage  May try Chlorpheniramine 4mg  daily at bedtime as needed. (Chlor tabs) Deslym 2 tsp Twice daily  As needed  Cough .  Follow with Dr. Valeta Harms or Sarinity Dicicco NP in 6 months  and As needed         COVID-19 long hauler Fatigue/activity intolerance and brain fog seems to be slowly improving  Continue on current regimen  And activity as tolerated.    Bronchiectasis without complication ([place]) Appears stable  Continue on current regimen   Plan  Patient Instructions  BREO 1 puff daily  , rinse after use .  Albuterol inhaler 1-2 puffs every 4hrs as needed.  Saline nasal rinses As needed   Saline nasal gel At bedtime   Flonase 2 puffs daily for 1 week , then As needed   Zyrtec 10mg  daily As needed  drainage  May try Chlorpheniramine 4mg  daily at bedtime as needed. (Chlor tabs) Deslym 2 tsp Twice daily  As needed  Cough .  Follow with Dr. Valeta Harms or Khasir Woodrome NP in 6 months  and As needed           Rexene Edison, NP 02/04/2021

## 2021-02-04 NOTE — Assessment & Plan Note (Signed)
Fatigue/activity intolerance and brain fog seems to be slowly improving  Continue on current regimen  And activity as tolerated.

## 2021-02-04 NOTE — Assessment & Plan Note (Signed)
Appears stable  Continue on current regimen   Plan  Patient Instructions  BREO 1 puff daily  , rinse after use .  Albuterol inhaler 1-2 puffs every 4hrs as needed.  Saline nasal rinses As needed   Saline nasal gel At bedtime   Flonase 2 puffs daily for 1 week , then As needed   Zyrtec 10mg  daily As needed  drainage  May try Chlorpheniramine 4mg  daily at bedtime as needed. (Chlor tabs) Deslym 2 tsp Twice daily  As needed  Cough .  Follow with Dr. Valeta Harms or Caitlynne Harbeck NP in 6 months  and As needed

## 2021-02-04 NOTE — Patient Instructions (Signed)
BREO 1 puff daily  , rinse after use .  Albuterol inhaler 1-2 puffs every 4hrs as needed.  Saline nasal rinses As needed   Saline nasal gel At bedtime   Flonase 2 puffs daily for 1 week , then As needed   Zyrtec 10mg  daily As needed  drainage  May try Chlorpheniramine 4mg  daily at bedtime as needed. (Chlor tabs) Deslym 2 tsp Twice daily  As needed  Cough .  Follow with Dr. Valeta Harms or Pansey Pinheiro NP in 6 months  and As needed

## 2021-02-04 NOTE — Assessment & Plan Note (Addendum)
Controlled on present regimen ,  Add saline nasal spray and gel .As needed    Plan  Patient Instructions  BREO 1 puff daily  , rinse after use .  Albuterol inhaler 1-2 puffs every 4hrs as needed.  Saline nasal rinses As needed   Saline nasal gel At bedtime   Flonase 2 puffs daily for 1 week , then As needed   Zyrtec 10mg  daily As needed  drainage  May try Chlorpheniramine 4mg  daily at bedtime as needed. (Chlor tabs) Deslym 2 tsp Twice daily  As needed  Cough .  Follow with Dr. Valeta Harms or Alistair Senft NP in 6 months  and As needed

## 2021-02-24 DIAGNOSIS — M1711 Unilateral primary osteoarthritis, right knee: Secondary | ICD-10-CM | POA: Diagnosis not present

## 2021-03-18 DIAGNOSIS — L0390 Cellulitis, unspecified: Secondary | ICD-10-CM | POA: Diagnosis not present

## 2021-04-23 DIAGNOSIS — Z1211 Encounter for screening for malignant neoplasm of colon: Secondary | ICD-10-CM | POA: Diagnosis not present

## 2021-04-23 DIAGNOSIS — R1310 Dysphagia, unspecified: Secondary | ICD-10-CM | POA: Diagnosis not present

## 2021-04-23 DIAGNOSIS — K219 Gastro-esophageal reflux disease without esophagitis: Secondary | ICD-10-CM | POA: Diagnosis not present

## 2021-06-10 DIAGNOSIS — R399 Unspecified symptoms and signs involving the genitourinary system: Secondary | ICD-10-CM | POA: Diagnosis not present

## 2021-06-10 DIAGNOSIS — H6121 Impacted cerumen, right ear: Secondary | ICD-10-CM | POA: Diagnosis not present

## 2021-06-10 DIAGNOSIS — E559 Vitamin D deficiency, unspecified: Secondary | ICD-10-CM | POA: Diagnosis not present

## 2021-06-10 DIAGNOSIS — I10 Essential (primary) hypertension: Secondary | ICD-10-CM | POA: Diagnosis not present

## 2021-07-28 DIAGNOSIS — H21233 Degeneration of iris (pigmentary), bilateral: Secondary | ICD-10-CM | POA: Diagnosis not present

## 2021-07-28 DIAGNOSIS — H02883 Meibomian gland dysfunction of right eye, unspecified eyelid: Secondary | ICD-10-CM | POA: Diagnosis not present

## 2021-07-28 DIAGNOSIS — H35033 Hypertensive retinopathy, bilateral: Secondary | ICD-10-CM | POA: Diagnosis not present

## 2021-07-28 DIAGNOSIS — H02886 Meibomian gland dysfunction of left eye, unspecified eyelid: Secondary | ICD-10-CM | POA: Diagnosis not present

## 2021-08-05 DIAGNOSIS — L570 Actinic keratosis: Secondary | ICD-10-CM | POA: Diagnosis not present

## 2021-10-14 DIAGNOSIS — R809 Proteinuria, unspecified: Secondary | ICD-10-CM | POA: Diagnosis not present

## 2021-11-13 DIAGNOSIS — L814 Other melanin hyperpigmentation: Secondary | ICD-10-CM | POA: Diagnosis not present

## 2021-11-13 DIAGNOSIS — X32XXXS Exposure to sunlight, sequela: Secondary | ICD-10-CM | POA: Diagnosis not present

## 2021-11-13 DIAGNOSIS — L821 Other seborrheic keratosis: Secondary | ICD-10-CM | POA: Diagnosis not present

## 2021-11-13 DIAGNOSIS — Z85828 Personal history of other malignant neoplasm of skin: Secondary | ICD-10-CM | POA: Diagnosis not present

## 2021-12-09 DIAGNOSIS — R82998 Other abnormal findings in urine: Secondary | ICD-10-CM | POA: Diagnosis not present

## 2021-12-09 DIAGNOSIS — I10 Essential (primary) hypertension: Secondary | ICD-10-CM | POA: Diagnosis not present

## 2021-12-09 DIAGNOSIS — E785 Hyperlipidemia, unspecified: Secondary | ICD-10-CM | POA: Diagnosis not present

## 2021-12-09 DIAGNOSIS — E559 Vitamin D deficiency, unspecified: Secondary | ICD-10-CM | POA: Diagnosis not present

## 2022-02-12 ENCOUNTER — Telehealth: Payer: Self-pay | Admitting: Adult Health

## 2022-02-12 DIAGNOSIS — R059 Cough, unspecified: Secondary | ICD-10-CM | POA: Diagnosis not present

## 2022-02-12 DIAGNOSIS — J069 Acute upper respiratory infection, unspecified: Secondary | ICD-10-CM | POA: Diagnosis not present

## 2022-02-12 MED ORDER — ALBUTEROL SULFATE HFA 108 (90 BASE) MCG/ACT IN AERS: 2.0000 | INHALATION_SPRAY | Freq: Four times a day (QID) | RESPIRATORY_TRACT | 6 refills | Status: AC | PRN

## 2022-02-12 NOTE — Telephone Encounter (Signed)
Just put her on my schedule for tomorrow. See if she can come at 1:30

## 2022-02-12 NOTE — Telephone Encounter (Signed)
Called and spoke with patient. She verbalized understanding. She stated that she received a call from her PCP's office to come in for a CXR today. She will go ahead and do the CXR. If her PCP decides to treat her, she will call us back to cancel the appt for tomorrow.   Nothing further needed at time of call.

## 2022-02-12 NOTE — Telephone Encounter (Signed)
Called and spoke with pt who states she has had a cough for about two days and states that she has been wheezing since overnight 1/24.  Pt said that when she coughs, she is getting up phlegm that is clear. Denies any complaints of chest discomfort or SOB.  Pt has been using her Breo inhaler as prescribed. Pt has not been using her albuterol inhaler and requested to have a new Rx sent to pharmacy so I have refilled pt's albuterol inhaler.    Pt has not been seen since 01/2021. No openings for at least the next week or two. Did schedule pt an appt with TP 2/8.  With pt's current symptoms, pt wants to know if there is anything that could be done to hold her over until her appt. Tammy, please advise.

## 2022-02-13 ENCOUNTER — Telehealth: Payer: Self-pay | Admitting: Adult Health

## 2022-02-13 ENCOUNTER — Other Ambulatory Visit: Payer: Self-pay | Admitting: Adult Health

## 2022-02-13 ENCOUNTER — Ambulatory Visit: Payer: Medicare Other | Admitting: Adult Health

## 2022-02-13 ENCOUNTER — Encounter: Payer: Self-pay | Admitting: Adult Health

## 2022-02-13 VITALS — BP 120/70 | HR 92 | Ht 67.5 in | Wt 191.2 lb

## 2022-02-13 DIAGNOSIS — R059 Cough, unspecified: Secondary | ICD-10-CM | POA: Diagnosis not present

## 2022-02-13 DIAGNOSIS — J209 Acute bronchitis, unspecified: Secondary | ICD-10-CM | POA: Insufficient documentation

## 2022-02-13 DIAGNOSIS — J302 Other seasonal allergic rhinitis: Secondary | ICD-10-CM | POA: Diagnosis not present

## 2022-02-13 MED ORDER — ALBUTEROL SULFATE (2.5 MG/3ML) 0.083% IN NEBU: 2.5000 mg | INHALATION_SOLUTION | Freq: Four times a day (QID) | RESPIRATORY_TRACT | 12 refills | Status: AC | PRN

## 2022-02-13 MED ORDER — FLUTICASONE FUROATE-VILANTEROL 100-25 MCG/ACT IN AEPB: 1.0000 | INHALATION_SPRAY | Freq: Every day | RESPIRATORY_TRACT | 5 refills | Status: DC

## 2022-02-13 MED ORDER — BENZONATATE 200 MG PO CAPS: 200.0000 mg | ORAL_CAPSULE | Freq: Three times a day (TID) | ORAL | 1 refills | Status: DC | PRN

## 2022-02-13 MED ORDER — AZITHROMYCIN 250 MG PO TABS: 250.0000 mg | ORAL_TABLET | Freq: Every day | ORAL | 0 refills | Status: DC

## 2022-02-13 NOTE — Patient Instructions (Addendum)
Taper Prednisone '40mg'$  daily for 2 days then '30mg'$  daily for 2 days, '20mg'$  daily for 2 days , then '10mg'$  daily and stop .  Restart Breo 1 puff daily, rinse after use.  Albuterol inhaler 1-2 puffs every 4hrs as needed.  Saline nasal rinses As needed   Saline nasal gel At bedtime   Flonase 2 puffs daily for 1 week , then As needed   Zyrtec '10mg'$  daily As needed  drainage  May try Chlorpheniramine '4mg'$  daily at bedtime as needed. (Chlor tabs) Deslym 2 tsp Twice daily  As needed  Cough .  Tessalon Three times a day  for cough As needed   Follow with Dr. Valeta Harms or Grisela Mesch NP in 6 weeks and As needed

## 2022-02-13 NOTE — Telephone Encounter (Signed)
Called and spoke to [facility] and she states the rx for [facility] was sent incorrect. Looked and update new rx for Breo to read 60 each not 30 each. She states she understood. Nothing further needed

## 2022-02-13 NOTE — Assessment & Plan Note (Addendum)
Acute bronchitis slowly resolving.  Adding cough control regimen.  Finish up steroid taper.  Recent chest x-ray without acute process.  Control for triggers Restart Breo Albuterol nebulizer treatment given in the office Extend Zithromax for 5 days   Plan  Patient Instructions  Taper Prednisone '40mg'$  daily for 2 days then '30mg'$  daily for 2 days, '20mg'$  daily for 2 days , then '10mg'$  daily and stop .  Restart Breo 1 puff daily, rinse after use.  Albuterol inhaler 1-2 puffs every 4hrs as needed.  Saline nasal rinses As needed   Saline nasal gel At bedtime   Flonase 2 puffs daily for 1 week , then As needed   Zyrtec '10mg'$  daily As needed  drainage  May try Chlorpheniramine '4mg'$  daily at bedtime as needed. (Chlor tabs) Deslym 2 tsp Twice daily  As needed  Cough .  Tessalon Three times a day  for cough As needed   Follow with Dr. Valeta Harms or Srah Ake NP in 6 weeks and As needed

## 2022-02-13 NOTE — Addendum Note (Signed)
Addended by: Melvenia Needles on: 02/13/2022 03:16 PM   Modules accepted: Orders

## 2022-02-13 NOTE — Progress Notes (Signed)
$'@Patient'L$  ID: Claudia Joseph, female    DOB: [birth year], 77 y.o.   MRN: [id number]  Chief Complaint  Patient presents with   Follow-up    Referring provider: No ref. 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   TEST/EVENTS :  05/14/16: FVC 2.92 L (89%) FEV1 2.43 L (98%) FEV1/FVC 0.83 FEF 25-75 2.88 L (140%) negative bronchodilator response TLC 4.85 L (88%) RV 83% ERV 138% DLCO corrected 76% 04/27/11: FVC 2.98 L (87%) FEV1 2.47 L (94%) FEV1/FVC 0.83 FEF 25-75 2.79 L (124%)   IMAGING CT CHEST W/O 06/08/13 (per radiologist): Two 4 mm nodules left lower lobe unchanged from prior CT imaging. 3 mm left upper lobe nodule unchanged. No new nodule. No axillary or supraclavicular lymphadenopathy. No mediastinal or hilar adenopathy. Nodule stable going back to 10/08/11   High-resolution CT chest October 2019 showed mild bronchiectasis most evident in the right lower and right middle lobes.  Several tiny 2 to 4 mm nodules throughout stable since 2015   LABS 07/18/15 CBC: 8.1/14.3/42.3/321 Eosinophils: 0.2 IgE: 18 RAST panel: Negative   04/27/11 IgE: 12.8 Hypersensitivity Pneumonitis Panel:  Aspergillus pullulans 1 band detected  02/13/2022 Acute OV : Cough  Patient presents for an acute office visit.  Patient complains over the last 5 to 6 days she has had cough, congestion and severe coughing paroxysms.  Patient was treated by primary care with a Z-Pak, prednisone taper and promethazine cough syrup.  Patient says she has not a whole lot better.  Chest x-ray yesterday showed no acute process.  Patient completed her last dose of Zithromax today.  She has no fever or discolored mucus.  Patient has several days of prednisone left.  Patient denies any hemoptysis, chest pain, orthopnea. Last day of ZPack is today.   Patient previously been on Breo but has been off of this for several months.    Allergies  Allergen Reactions   Meperidine Hcl     Demerol causes severe HAs   Morphine And Related     Causes bad headaches per patient    Immunization History  Administered Date(s) Administered   PPD Test 08/13/2014, 10/20/2016    Past Medical History:  Diagnosis Date   COVID    GERD (gastroesophageal reflux disease)    Head pain, chronic    history of   Hernia    Hypertension     Tobacco History: Social History   Tobacco Use  Smoking Status Never  Smokeless Tobacco Never   Counseling given: Not Answered   Outpatient Medications Prior to Visit  Medication Sig Dispense Refill   albuterol (VENTOLIN HFA) 108 (90 Base) MCG/ACT inhaler Inhale 2 puffs into the lungs every 6 (six) hours as needed for wheezing or shortness of breath. 8.5 each 6   amLODipine (NORVASC) 10 MG tablet Take 10 mg by mouth daily.     aspirin EC 81 MG tablet Take 81 mg by mouth daily. Swallow whole.     cholecalciferol (VITAMIN D) 1000 units tablet Take 1,000 Units by mouth daily.     metoprolol succinate (TOPROL-XL) 25 MG 24 hr tablet Take 25 mg by mouth daily.  0   potassium chloride (K-DUR,KLOR-CON) 10 MEQ tablet Take 1 tablet by mouth 2 (two) times daily.     RABEprazole (ACIPHEX) 20 MG tablet Take 20 mg  by mouth daily as needed.  3   fluticasone furoate-vilanterol (BREO ELLIPTA) 100-25 MCG/INH AEPB Inhale 1 puff into the lungs daily. (Patient not taking: Reported on 02/13/2022) 1 each 0   No facility-administered medications prior to visit.     Review of Systems:   Constitutional:   No  weight loss, night sweats,  Fevers, chills, + fatigue, or  lassitude.  HEENT:   No headaches,  Difficulty swallowing,  Tooth/dental problems, or  Sore throat,                No sneezing, itching, ear ache,  +nasal congestion, post nasal drip,   CV:  No chest pain,  Orthopnea, PND, swelling in lower extremities, anasarca,  dizziness, palpitations, syncope.   GI  No heartburn, indigestion, abdominal pain, nausea, vomiting, diarrhea, change in bowel habits, loss of appetite, bloody stools.   Resp:   No chest wall deformity  Skin: no rash or lesions.  GU: no dysuria, change in color of urine, no urgency or frequency.  No flank pain, no hematuria   MS:  No joint pain or swelling.  No decreased range of motion.  No back pain.    Physical Exam  BP 120/70 (BP Location: Left Arm)   Pulse 92   Ht 5' 7.5" (1.715 m)   Wt 191 lb 3.2 oz (86.7 kg)   SpO2 94%   BMI 29.50 kg/m   GEN: A/Ox3; pleasant , NAD, well nourished    HEENT:  [place]/AT,  NOSE-clear, THROAT-clear, no lesions, no postnasal drip or exudate noted.   NECK:  Supple w/ fair ROM; no JVD; normal carotid impulses w/o bruits; no thyromegaly or nodules palpated; no lymphadenopathy.    RESP  few trace rhonchi  no accessory muscle use, no dullness to percussion  CARD:  RRR, no m/r/g, no peripheral edema, pulses intact, no cyanosis or clubbing.  GI:   Soft & nt; nml bowel sounds; no organomegaly or masses detected.   Musco: Warm bil, no deformities or joint swelling noted.   Neuro: alert, no focal deficits noted.    Skin: Warm, no lesions or rashes    Lab Results:  CBC   BNP No results found for: "BNP"  ProBNP No results found for: "PROBNP"  Imaging: No results found.       Latest Ref Rng & Units 05/14/2016   11:47 AM  PFT Results  FVC-Pre L 2.92   FVC-Predicted Pre % 89   FVC-Post L 2.83   FVC-Predicted Post % 86   Pre FEV1/FVC % % 83   Post FEV1/FCV % % 89   FEV1-Pre L 2.43   FEV1-Predicted Pre % 98   FEV1-Post L 2.52   DLCO uncorrected ml/min/mmHg 22.61   DLCO UNC% % 79   DLCO corrected ml/min/mmHg 21.57   DLCO COR %Predicted % 76   DLVA Predicted % 94   TLC L 4.85   TLC % Predicted % 88   RV % Predicted % 83     No results found for: "NITRICOXIDE"      Assessment & Plan:   Acute bronchitis Acute  bronchitis slowly resolving.  Hold on additional antibiotics.  Complete Zithromax today as planned.  Adding cough control regimen.  Finish up steroid taper.  Recent chest x-ray without acute process.  Control for triggers [place]  Patient Instructions  Taper Prednisone '40mg'$  daily for 2 days then '30mg'$  daily for 2 days, '20mg'$  daily for 2 days , then '10mg'$  daily  and stop .  Restart Breo 1 puff daily, rinse after use.  Albuterol inhaler 1-2 puffs every 4hrs as needed.  Saline nasal rinses As needed   Saline nasal gel At bedtime   Flonase 2 puffs daily for 1 week , then As needed   Zyrtec '10mg'$  daily As needed  drainage  May try Chlorpheniramine '4mg'$  daily at bedtime as needed. (Chlor tabs) Deslym 2 tsp Twice daily  As needed  Cough .  Tessalon Three times a day  for cough As needed   Follow with Dr. Valeta Harms or Symeon Puleo NP in 6 weeks and As needed       Chronic seasonal allergic rhinitis Restart maintenance regimen     Rexene Edison, NP 02/13/2022

## 2022-02-13 NOTE — Assessment & Plan Note (Signed)
Restart maintenance regimen

## 2022-02-26 ENCOUNTER — Ambulatory Visit: Payer: Medicare Other | Admitting: Adult Health

## 2022-03-05 LAB — VITAMIN D 25 HYDROXY (VIT D DEFICIENCY, FRACTURES): Vit D, 25-Hydroxy: 32.8

## 2022-03-05 LAB — HEPATIC FUNCTION PANEL
ALT: 30 U/L (ref 7–35)
AST: 36 — AB (ref 13–35)
Alkaline Phosphatase: 146 — AB (ref 25–125)
Bilirubin, Total: 0.2

## 2022-03-05 LAB — IRON,TIBC AND FERRITIN PANEL
Ferritin: 241
Iron: 76

## 2022-03-05 LAB — VITAMIN B12: Vitamin B-12: 1075

## 2022-03-05 LAB — COMPREHENSIVE METABOLIC PANEL
Calcium: 10.1 (ref 8.7–10.7)
eGFR: 77

## 2022-03-05 LAB — TSH: TSH: 1.96 (ref 0.41–5.90)

## 2022-03-05 LAB — CBC AND DIFFERENTIAL
Hemoglobin: 15.3 (ref 12.0–16.0)
Platelets: 329 10*3/uL (ref 150–400)
WBC: 7.8

## 2022-03-05 LAB — BASIC METABOLIC PANEL
Chloride: 101 (ref 99–108)
Creatinine: 0.8 (ref 0.5–1.1)
Potassium: 4.1 mEq/L (ref 3.5–5.1)
Sodium: 140 (ref 137–147)

## 2022-03-09 DIAGNOSIS — R0602 Shortness of breath: Secondary | ICD-10-CM | POA: Diagnosis not present

## 2022-03-09 DIAGNOSIS — M6281 Muscle weakness (generalized): Secondary | ICD-10-CM | POA: Diagnosis not present

## 2022-03-09 DIAGNOSIS — R531 Weakness: Secondary | ICD-10-CM | POA: Diagnosis not present

## 2022-03-09 DIAGNOSIS — G9331 Postviral fatigue syndrome: Secondary | ICD-10-CM | POA: Diagnosis not present

## 2022-03-09 LAB — SEDIMENTATION RATE
EGFR: 77
Sedimentation Rate-Westergren: 10

## 2022-03-13 ENCOUNTER — Telehealth: Payer: Self-pay | Admitting: *Deleted

## 2022-03-13 NOTE — Telephone Encounter (Signed)
Pt request to ReEstablish with Dr.Metheney as a patient- Her sister's Harless Nakayama and Christella Noa and good friend Delford Field see Dr.Metheney as patients. Is it okay to schedule pt with you?

## 2022-03-16 DIAGNOSIS — H21233 Degeneration of iris (pigmentary), bilateral: Secondary | ICD-10-CM | POA: Diagnosis not present

## 2022-03-16 NOTE — Telephone Encounter (Signed)
Okay to schedule

## 2022-03-17 NOTE — Telephone Encounter (Signed)
Scheduled pt

## 2022-03-25 ENCOUNTER — Encounter: Payer: Self-pay | Admitting: Adult Health

## 2022-03-25 ENCOUNTER — Ambulatory Visit: Payer: Medicare Other | Admitting: Adult Health

## 2022-03-25 VITALS — BP 112/70 | HR 80 | Temp 98.3°F | Ht 67.0 in | Wt 187.6 lb

## 2022-03-25 DIAGNOSIS — R059 Cough, unspecified: Secondary | ICD-10-CM | POA: Diagnosis not present

## 2022-03-25 DIAGNOSIS — J45909 Unspecified asthma, uncomplicated: Secondary | ICD-10-CM

## 2022-03-25 LAB — POCT EXHALED NITRIC OXIDE: FeNO level (ppb): 44

## 2022-03-25 MED ORDER — HYDROCODONE BIT-HOMATROP MBR 5-1.5 MG/5ML PO SOLN: 5.0000 mL | Freq: Every evening | ORAL | 0 refills | Status: DC | PRN

## 2022-03-25 MED ORDER — AMOXICILLIN-POT CLAVULANATE 875-125 MG PO TABS: 1.0000 | ORAL_TABLET | Freq: Two times a day (BID) | ORAL | 0 refills | Status: DC

## 2022-03-25 MED ORDER — PREDNISONE 20 MG PO TABS: 20.0000 mg | ORAL_TABLET | Freq: Every day | ORAL | 0 refills | Status: DC

## 2022-03-25 NOTE — Patient Instructions (Addendum)
Augmentin '875mg'$  Twice daily  for 1 week, take with food.  Prednisone '20mg'$  daily for 5 days .  Breo 1 puff daily, rinse after use.  Albuterol inhaler  or neb as needed   Saline nasal rinses As needed   Saline nasal gel At bedtime   Flonase 2 puffs daily  Change to Zyrtec to Allegra '180mg'$  daily  Deslym 2 tsp Twice daily  As needed  Cough .  Tessalon Three times a day  for cough As needed   Hydromet 1 tsp At bedtime  As needed  cough .  Follow with Dr. Valeta Harms or Alyssandra Hulsebus NP in 6 weeks and As needed   Please contact office for sooner follow up if symptoms do not improve or worsen or seek emergency care

## 2022-03-25 NOTE — Assessment & Plan Note (Signed)
Acute asthmatic bronchitic exacerbation-recurrent' \\Will'$  treat with empiric antibiotics and steroids.  Exhaled nitric oxide testing is elevated today.  Begin aggressive trigger prevention with Allegra, Flonase.  Add in GERD rx, Prilosec daily . Along with cough control regimen  If not improving on return visit would recommend further testing with labs and repeat PFTs and chest x-ray.  Plan  Patient Instructions  Augmentin '875mg'$  Twice daily  for 1 week, take with food.  Prednisone '20mg'$  daily for 5 days .  Breo 1 puff daily, rinse after use.  Albuterol inhaler  or neb as needed   Saline nasal rinses As needed   Saline nasal gel At bedtime   Flonase 2 puffs daily  Change to Zyrtec to Allegra '180mg'$  daily  Deslym 2 tsp Twice daily  As needed  Cough .  Tessalon Three times a day  for cough As needed   Hydromet 1 tsp At bedtime  As needed  cough .  Follow with Dr. Valeta Harms or Sira Adsit NP in 6 weeks and As needed   Please contact office for sooner follow up if symptoms do not improve or worsen or seek emergency care

## 2022-03-25 NOTE — Progress Notes (Signed)
$'@Patient'r$  ID: Claudia Joseph, female    DOB: 18-Jun-1945, 77 y.o.   MRN: [id number]  Chief Complaint  Patient presents with   Acute Visit    Referring provider: No ref. 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 3 mm left upper lobe nodule unchanged. No new nodule. No axillary or supraclavicular lymphadenopathy. No mediastinal or hilar adenopathy. Nodule stable going back to 10/08/11   High-resolution CT chest October 2019 showed mild bronchiectasis most evident in the right lower and right middle lobes.  Several tiny 2 to 4 mm nodules throughout stable since 2015   LABS 07/18/15 CBC: 8.1/14.3/42.3/321 Eosinophils: 0.2 IgE: 18 RAST panel: Negative   04/27/11 IgE: 12.8 Hypersensitivity Pneumonitis Panel:  Aspergillus pullulans 1 band detected  03/25/2022 Acute OV : Cough  Patient presents for an acute office visit.  She complains over the last 2 weeks that she has had increased cough, congestion with thick mucus, wheezing and shortness of breath.  Patient was seen 6 weeks ago for an acute bronchitis.  She was treated with a Z-Pak and prednisone taper.  She was recommended to start Breo and aggressive trigger prevention with Zyrtec Flonase and Delsym.  Patient says she did get better however her husband got sick with bronchitis and shortly after she started to have symptoms again.  She says she has been using some Delsym.  She did restart  Breo.  But continues to have ongoing symptoms and cough is keeping her up at night.  She is having a hard time sleeping.  She denies any fever, hemoptysis, chest pain, orthopnea, edema.  She did have a chest x-ray in January during her bronchitis episode and was reported as clear. Exhaled nitric oxide testing elevated today at 44ppb  Allergies  Allergen Reactions   Meperidine Hcl     Demerol causes severe HAs   Morphine And Related     Causes bad headaches per patient    Immunization History  Administered Date(s) Administered   PPD Test 08/13/2014, 10/20/2016    Past Medical History:  Diagnosis Date   COVID    GERD (gastroesophageal reflux disease)    Head pain, chronic    history of   Hernia    Hypertension     Tobacco History: Social History   Tobacco Use  Smoking Status Never  Smokeless Tobacco Never   Counseling given: Not Answered   Outpatient Medications Prior to Visit  Medication Sig Dispense Refill   albuterol (PROVENTIL) (2.5 MG/3ML) 0.083% nebulizer solution Take 3 mLs (2.5 mg total) by nebulization every 6 (six) hours as needed for wheezing or shortness of breath. 75 mL 12   albuterol (VENTOLIN HFA) 108 (90 Base) MCG/ACT inhaler Inhale 2 puffs into the lungs every 6 (six) hours as needed for wheezing or shortness of breath. 8.5 each 6   amLODipine (NORVASC) 10 MG tablet Take 10 mg by mouth daily.     aspirin EC 81 MG tablet  Take 81 mg by mouth daily. Swallow whole.     cholecalciferol (VITAMIN D) 1000 units tablet Take 1,000 Units by mouth daily.     fluticasone furoate-vilanterol (BREO ELLIPTA) 100-25 MCG/ACT AEPB Inhale 1 puff into the lungs daily. 60 each 5   metoprolol succinate (TOPROL-XL) 25 MG 24 hr tablet Take 25 mg by mouth daily.  0   potassium chloride (K-DUR,KLOR-CON) 10 MEQ tablet Take 1 tablet by mouth 2 (two) times daily.     RABEprazole (ACIPHEX) 20 MG tablet Take 20 mg by mouth daily as needed.  3   benzonatate (TESSALON) 200 MG capsule Take  1 capsule (200 mg total) by mouth 3 (three) times daily as needed. (Patient not taking: Reported on 03/25/2022) 45 capsule 1   azithromycin (ZITHROMAX Z-PAK) 250 MG tablet Take 1 tablet (250 mg total) by mouth daily. (Patient not taking: Reported on 03/25/2022) 5 each 0   No facility-administered medications prior to visit.     Review of Systems:   Constitutional:   No  weight loss, night sweats,  Fevers, chills, fatigue, or  lassitude.  HEENT:   No headaches,  Difficulty swallowing,  Tooth/dental problems, or  Sore throat,                No sneezing, itching, ear ache,  +nasal congestion, post nasal drip,   CV:  No chest pain,  Orthopnea, PND, swelling in lower extremities, anasarca, dizziness, palpitations, syncope.   GI  No heartburn, indigestion, abdominal pain, nausea, vomiting, diarrhea, change in bowel habits, loss of appetite, bloody stools.   Resp: .  No chest wall deformity  Skin: no rash or lesions.  GU: no dysuria, change in color of urine, no urgency or frequency.  No flank pain, no hematuria   MS:  No joint pain or swelling.  No decreased range of motion.  No back pain.    Physical Exam  BP 112/70 (BP Location: Left Arm, Patient Position: Sitting, Cuff Size: Large)   Pulse 80   Temp 98.3 F (36.8 C) (Oral)   Ht '5\' 7"'$  (1.702 m)   Wt 187 lb 9.6 oz (85.1 kg)   SpO2 98%   BMI 29.38 kg/m   GEN: A/Ox3; pleasant , NAD, well nourished    HEENT:  [place]/AT,   NOSE-clear, THROAT-clear, no lesions, no postnasal drip or exudate noted.   NECK:  Supple w/ fair ROM; no JVD; normal carotid impulses w/o bruits; no thyromegaly or nodules palpated; no lymphadenopathy.    RESP  Clear  P & A; w/o, wheezes/ rales/ or rhonchi. no accessory muscle use, no dullness to percussion  CARD:  RRR, no m/r/g, no peripheral edema, pulses intact, no cyanosis or clubbing.  GI:   Soft & nt; nml bowel sounds; no organomegaly or masses detected.   Musco: Warm bil, no deformities or joint swelling  noted.   Neuro: alert, no focal deficits noted.    Skin: Warm, no lesions or rashes    Lab Results:  CBC    Component Value Date/Time   WBC 10.5 07/07/2020 1559   RBC 4.90 07/07/2020 1559   HGB 14.9 07/07/2020 1559   HCT 45.7 (H) 07/07/2020 1559   PLT 245 07/07/2020 1559   MCV 93.3 07/07/2020 1559   MCH 30.4 07/07/2020 1559   MCHC 32.6 07/07/2020 1559   RDW 12.8 07/07/2020 1559   LYMPHSABS 2,814 07/07/2020 1559   MONOABS 0.5 07/18/2015 1019   EOSABS 347 07/07/2020 1559   BASOSABS 42 07/07/2020  1559    BMET    Component Value Date/Time   NA 139 07/07/2020 1559   K 3.6 07/07/2020 1559   CL 106 07/07/2020 1559   CO2 20 07/07/2020 1559   GLUCOSE 103 (H) 07/07/2020 1559   BUN 12 07/07/2020 1559   CREATININE 0.93 07/07/2020 1559   CALCIUM 9.6 07/07/2020 1559   GFRNONAA 60 07/07/2020 1559   GFRAA 69 07/07/2020 1559    BNP No results found for: "BNP"  ProBNP No results found for: "PROBNP"  Imaging: No results found.       Latest Ref Rng & Units 05/14/2016   11:47 AM  PFT Results  FVC-Pre L 2.92   FVC-Predicted Pre % 89   FVC-Post L 2.83   FVC-Predicted Post % 86   Pre FEV1/FVC % % 83   Post FEV1/FCV % % 89   FEV1-Pre L 2.43   FEV1-Predicted Pre % 98   FEV1-Post L 2.52   DLCO uncorrected ml/min/mmHg 22.61   DLCO UNC% % 79   DLCO corrected ml/min/mmHg 21.57   DLCO COR %Predicted % 76   DLVA Predicted % 94   TLC L 4.85   TLC % Predicted % 88   RV % Predicted % 83     No results found for: "NITRICOXIDE"      Assessment & Plan:   Acute asthmatic bronchitis Acute asthmatic bronchitic exacerbation-recurrent' \\Will'$  treat with empiric antibiotics and steroids.  Exhaled nitric oxide testing is elevated today.  Begin aggressive trigger prevention with Allegra, Flonase.  Add in GERD rx, Prilosec daily . Along with cough control regimen  If not improving on return visit would recommend further testing with labs and repeat PFTs and chest x-ray.  Plan   Patient Instructions  Augmentin '875mg'$  Twice daily  for 1 week, take with food.  Prednisone '20mg'$  daily for 5 days .  Breo 1 puff daily, rinse after use.  Albuterol inhaler  or neb as needed   Saline nasal rinses As needed   Saline nasal gel At bedtime   Flonase 2 puffs daily  Change to Zyrtec to Allegra '180mg'$  daily  Deslym 2 tsp Twice daily  As needed  Cough .  Tessalon Three times a day  for cough As needed   Hydromet 1 tsp At bedtime  As needed  cough .  Follow with Dr. Valeta Harms or Obediah Welles NP in 6 weeks and As needed   Please contact office for sooner follow up if symptoms do not improve or worsen or seek emergency care        Rexene Edison, NP 03/25/2022

## 2022-03-27 ENCOUNTER — Ambulatory Visit: Payer: Medicare Other | Admitting: Family Medicine

## 2022-04-01 DIAGNOSIS — M205X1 Other deformities of toe(s) (acquired), right foot: Secondary | ICD-10-CM | POA: Diagnosis not present

## 2022-04-01 DIAGNOSIS — M2022 Hallux rigidus, left foot: Secondary | ICD-10-CM | POA: Diagnosis not present

## 2022-04-01 DIAGNOSIS — M792 Neuralgia and neuritis, unspecified: Secondary | ICD-10-CM | POA: Diagnosis not present

## 2022-04-15 ENCOUNTER — Ambulatory Visit (INDEPENDENT_AMBULATORY_CARE_PROVIDER_SITE_OTHER): Payer: Medicare Other | Admitting: Family Medicine

## 2022-04-15 ENCOUNTER — Encounter: Payer: Self-pay | Admitting: Family Medicine

## 2022-04-15 VITALS — BP 136/60 | HR 57 | Ht 67.0 in | Wt 186.0 lb

## 2022-04-15 DIAGNOSIS — I10 Essential (primary) hypertension: Secondary | ICD-10-CM | POA: Diagnosis not present

## 2022-04-15 DIAGNOSIS — K760 Fatty (change of) liver, not elsewhere classified: Secondary | ICD-10-CM

## 2022-04-15 DIAGNOSIS — I700 Atherosclerosis of aorta: Secondary | ICD-10-CM | POA: Diagnosis not present

## 2022-04-15 DIAGNOSIS — J479 Bronchiectasis, uncomplicated: Secondary | ICD-10-CM

## 2022-04-15 DIAGNOSIS — R7989 Other specified abnormal findings of blood chemistry: Secondary | ICD-10-CM

## 2022-04-15 NOTE — Progress Notes (Signed)
Established Patient Office Visit  Subjective   Patient ID: Claudia Joseph, female    DOB: 19-May-1945  Age: 77 y.o. MRN: [id number]  Chief Complaint  Patient presents with   Establish Care    HPI  She is here to establish care.  Actually seen both of her sisters and I actually saw her most 15 years ago.  She then transferred to a PA who has followed with her for about the last 14 years but she recently retired so she has been looking for a new PCP.  Hypertension- Pt denies chest pain, SOB, dizziness, or heart palpitations.  Taking meds as directed w/o problems.  Denies medication side effects.  None of her medications have changed recently she is on the same doses.  She was struggling with some fatigue recently and said had a consult with a physician in [place] that she had seen a few times.  They did do some extensive blood work she meant to drop off the form with her today.  She forgot but will bring it back by.  She says they are about 5 abnormalities on their 1 in particular was not elevated ferritin level that needs to be followed up.  She lost about 8 pounds while she was sick she was also dealing with bronchitis around that time.  She was recently treated with Augmentin cough syrup and prednisone about 2 and half weeks ago and says she is feeling much better overall.  She has a history of recurrent bronchitis.  She also follows with pulmonology.  She follows up with Tammy every 6 months.  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  Also noted with some aortic atherosclerosis in the left coronary artery.  As well as hepatic steatosis.  He did let me know that she does not do any vaccinations and has not for years.  She reports that she used to take potassium daily for years but has been off for about the last year she is not sure if it was included in the lab work she just had done recently.  She also takes vitamin  D.    ROS    Objective:     BP 136/60   Pulse (!) 57   Ht 5\' 7"  (1.702 m)   Wt 186 lb (84.4 kg)   SpO2 98%   BMI 29.13 kg/m    Physical Exam Vitals and nursing note reviewed.  Constitutional:      Appearance: She is well-developed.  HENT:     Head: Normocephalic and atraumatic.  Neck:     Vascular: No carotid bruit.  Cardiovascular:     Rate and Rhythm: Normal rate and regular rhythm.     Heart sounds: Normal heart sounds.  Pulmonary:     Effort: Pulmonary effort is normal.     Breath sounds: Normal breath sounds.  Skin:    General: Skin is warm and dry.  Neurological:     Mental Status: She is alert and oriented to person, place, and time.  Psychiatric:        Behavior: Behavior normal.      No results found for any visits on 04/15/22.    The ASCVD Risk score (Arnett DK, et al., 2019) failed to calculate for the following reasons:   Cannot find a previous HDL lab    Assessment & Plan:   Problem List Items Addressed This Visit  Cardiovascular and Mediastinum   Hypertension - Primary    Blood pressure just a little borderline today she did bring in her home cuff for comparison as well.      Aortic atherosclerosis ([place])    Noted on CT back in 2019.  She is not currently on a statin.        Respiratory   Bronchiectasis without complication ([place])    She does follow with time Tammy.  Every 6 months.  Last CT was in 2019.        Digestive   Hepatic steatosis    The liver noted on CT scan done back in 2019.  Will keep an eye on her liver function again she is can to drop off those labs will make sure that those are normal.      Other Visit Diagnoses     Elevated ferritin          Elevated ferritin-again I need to see the contacts and I need to see the rest of the iron panel we did discuss that ferritin can be a sign of iron overload but it can also be an inflammatory marker.  She is can I drop off the labs and him to take a look to see  if there is things that we need to address her either follow-up on.  He does decline all vaccines today.  Return in about 6 weeks (around 05/27/2022) for Wellness Exam.    Beatrice Lecher, MD

## 2022-04-15 NOTE — Assessment & Plan Note (Signed)
Noted on CT back in 2019.  She is not currently on a statin.

## 2022-04-15 NOTE — Assessment & Plan Note (Signed)
She does follow with time Tammy.  Every 6 months.  Last CT was in 2019.

## 2022-04-15 NOTE — Assessment & Plan Note (Signed)
The liver noted on CT scan done back in 2019.  Will keep an eye on her liver function again she is can to drop off those labs will make sure that those are normal.

## 2022-04-15 NOTE — Patient Instructions (Signed)
Please drop-off your lab and we will get those into the system and we will also review them and see what we need to do going forward.

## 2022-04-15 NOTE — Assessment & Plan Note (Signed)
Blood pressure just a little borderline today she did bring in her home cuff for comparison as well.

## 2022-04-15 NOTE — Progress Notes (Signed)
Home BP 137/74 p:56

## 2022-05-06 ENCOUNTER — Ambulatory Visit: Payer: Medicare Other | Admitting: Adult Health

## 2022-05-18 ENCOUNTER — Encounter: Payer: Self-pay | Admitting: Adult Health

## 2022-05-18 ENCOUNTER — Ambulatory Visit: Payer: Medicare Other | Admitting: Adult Health

## 2022-05-18 VITALS — BP 130/64 | HR 74 | Temp 98.2°F | Ht 67.0 in | Wt 187.6 lb

## 2022-05-18 DIAGNOSIS — J45909 Unspecified asthma, uncomplicated: Secondary | ICD-10-CM | POA: Diagnosis not present

## 2022-05-18 DIAGNOSIS — J302 Other seasonal allergic rhinitis: Secondary | ICD-10-CM

## 2022-05-18 NOTE — Progress Notes (Unsigned)
@Patient  ID: Claudia Joseph, female    DOB: [birth year], 77 y.o.   MRN: [id number]  No chief complaint on file.   Referring provider: No ref. 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 3 mm left upper lobe nodule unchanged. No new nodule. No axillary or supraclavicular lymphadenopathy. No mediastinal or hilar adenopathy. Nodule stable going back to 10/08/11   High-resolution CT chest October 2019 showed mild bronchiectasis most evident in the right lower and right middle lobes.  Several tiny 2 to 4 mm nodules throughout stable since 2015   LABS 07/18/15 CBC: 8.1/14.3/42.3/321 Eosinophils: 0.2 IgE: 18 RAST panel: Negative   04/27/11 IgE: 12.8 Hypersensitivity Pneumonitis Panel:  Aspergillus pullulans 1 band detected  05/18/2022 Follow up : Asthmatic bronchitis Patient presents for a 6-week follow-up.  Patient was seen last visit with a slow to resolve asthmatic bronchitic exacerbation.  She was treated with Augmentin x 1 week and a prednisone burst.  Patient says she is feeling so much better.  She is back to baseline.  Cough and congestion have decreased substantially.  She remains on Breo daily.  Not having to use albuterol as much.  She has changed to Allegra which she feels is really helping her allergy symptoms. Declines vaccines.  Allergies  Allergen Reactions   Hydromorphone Hcl Other (See Comments)   Meperidine Hcl      Demerol causes severe HAs   Morphine And Related     Causes bad headaches per patient    Immunization History  Administered Date(s) Administered   PPD Test 08/13/2014, 10/20/2016    Past Medical History:  Diagnosis Date   COVID    GERD (gastroesophageal reflux disease)    Head pain, chronic    history of   Hernia    Hypertension     Tobacco History: Social History   Tobacco Use  Smoking Status Never  Smokeless Tobacco Never   Counseling given: Not Answered   Outpatient Medications Prior to Visit  Medication Sig Dispense Refill   albuterol (PROVENTIL) (2.5 MG/3ML) 0.083% nebulizer solution Take 3 mLs (2.5 mg total) by nebulization every 6 (six) hours as needed for wheezing or shortness of breath. 75 mL 12   albuterol (VENTOLIN HFA) 108 (90 Base) MCG/ACT inhaler Inhale 2 puffs into the lungs every 6 (six) hours as needed for wheezing or shortness of breath. 8.5 each 6   amLODipine (NORVASC) 10 MG tablet Take 10 mg by mouth daily.     aspirin EC 81 MG tablet Take 81 mg by mouth daily. Swallow whole.     cholecalciferol (VITAMIN D) 1000 units tablet Take 1,000 Units by mouth daily.     metoprolol succinate (TOPROL-XL) 25 MG 24 hr tablet Take 25 mg by mouth daily.  0   No facility-administered medications prior to visit.     Review of Systems:   Constitutional:   No  weight loss, night sweats,  Fevers, chills, fatigue,  or  lassitude.  HEENT:   No headaches,  Difficulty swallowing,  Tooth/dental problems, or  Sore throat,                No sneezing, itching, ear ache,  +nasal congestion, post nasal drip,   CV:  No chest pain,  Orthopnea, PND, swelling in lower extremities, anasarca, dizziness, palpitations, syncope.   GI  No heartburn, indigestion, abdominal pain, nausea, vomiting, diarrhea, change in bowel habits, loss of appetite, bloody stools.   Resp: No shortness of breath with exertion or at rest.  No excess mucus, no productive cough,  No non-productive cough,   No coughing up of blood.  No change in color of mucus.  No wheezing.  No chest wall deformity  Skin: no rash or lesions.  GU: no dysuria, change in color of urine, no urgency or frequency.  No flank pain, no hematuria   MS:  No joint pain or swelling.  No decreased range of motion.  No back pain.    Physical Exam  BP 130/64 (BP Location: Left Arm, Patient Position: Sitting, Cuff Size: Large)   Pulse 74   Temp 98.2 F (36.8 C) (Oral)   Ht 5\' 7"  (1.702 m)   Wt 187 lb 9.6 oz (85.1 kg)   SpO2 98%   BMI 29.38 kg/m   GEN: A/Ox3; pleasant , NAD, well nourished    HEENT:  [place]/AT,  NOSE-clear, THROAT-clear, no lesions, no postnasal drip or exudate noted.   NECK:  Supple w/ fair ROM; no JVD; normal carotid impulses w/o bruits; no thyromegaly or nodules palpated; no lymphadenopathy.    RESP  Clear  P & A; w/o, wheezes/ rales/ or rhonchi. no accessory muscle use, no dullness to percussion  CARD:  RRR, no m/r/g, no peripheral edema, pulses intact, no cyanosis or clubbing.  GI:   Soft & nt; nml bowel sounds; no organomegaly or masses detected.   Musco: Warm bil, no deformities or joint swelling noted.   Neuro: alert, no focal deficits noted.    Skin: Warm, no lesions or rashes    Lab Results:  CBC  BNP No results found for: "BNP"  ProBNP No results found for: "PROBNP"  Imaging: No results found.       Latest Ref Rng & Units 05/14/2016   11:47 AM  PFT Results  FVC-Pre L 2.92   FVC-Predicted Pre % 89   FVC-Post L 2.83   FVC-Predicted Post % 86   Pre FEV1/FVC % % 83   Post FEV1/FCV % % 89   FEV1-Pre L 2.43   FEV1-Predicted Pre % 98   FEV1-Post L 2.52   DLCO uncorrected ml/min/mmHg 22.61   DLCO UNC% % 79   DLCO corrected ml/min/mmHg 21.57   DLCO COR %Predicted % 76   DLVA Predicted % 94   TLC L 4.85   TLC % Predicted % 88   RV % Predicted % 83     No results found for: "NITRICOXIDE"      Assessment & Plan:   No problem-specific Assessment & Plan  notes found for this encounter.     Rubye Oaks, NP 05/18/2022

## 2022-05-18 NOTE — Patient Instructions (Addendum)
Breo 1 puff daily, rinse after use.  Albuterol inhaler  or neb as needed   Saline nasal rinses As needed   Saline nasal gel At bedtime   Flonase 2 puffs daily  Allegra 180mg  daily  Follow with Dr. Tonia Brooms or Illyana Schorsch NP in 6 months and As needed   Please contact office for sooner follow up if symptoms do not improve or worsen or seek emergency care

## 2022-05-19 NOTE — Assessment & Plan Note (Signed)
Improved control on Allegra and Flonase.  No changes

## 2022-05-19 NOTE — Assessment & Plan Note (Signed)
Recent slow to resolve flare-improved with antibiotics and steroids.  Patient is continue on preventative regimen with Breo, Allegra, Flonase.  Plan  Patient Instructions  Breo 1 puff daily, rinse after use.  Albuterol inhaler  or neb as needed   Saline nasal rinses As needed   Saline nasal gel At bedtime   Flonase 2 puffs daily  Allegra 180mg  daily  Follow with Dr. Tonia Brooms or Lundon Rosier NP in 6 months and As needed   Please contact office for sooner follow up if symptoms do not improve or worsen or seek emergency care

## 2022-05-27 ENCOUNTER — Ambulatory Visit (INDEPENDENT_AMBULATORY_CARE_PROVIDER_SITE_OTHER): Payer: Medicare Other | Admitting: Family Medicine

## 2022-05-27 ENCOUNTER — Encounter: Payer: Self-pay | Admitting: Family Medicine

## 2022-05-27 VITALS — BP 135/65 | HR 66 | Ht 67.0 in | Wt 187.0 lb

## 2022-05-27 DIAGNOSIS — I10 Essential (primary) hypertension: Secondary | ICD-10-CM

## 2022-05-27 DIAGNOSIS — I700 Atherosclerosis of aorta: Secondary | ICD-10-CM

## 2022-05-27 DIAGNOSIS — Z1159 Encounter for screening for other viral diseases: Secondary | ICD-10-CM | POA: Diagnosis not present

## 2022-05-27 DIAGNOSIS — Z0000 Encounter for general adult medical examination without abnormal findings: Secondary | ICD-10-CM | POA: Diagnosis not present

## 2022-05-27 MED ORDER — METOPROLOL SUCCINATE ER 25 MG PO TB24: 25.0000 mg | ORAL_TABLET | Freq: Every day | ORAL | 1 refills | Status: DC

## 2022-05-27 MED ORDER — AMLODIPINE BESYLATE 10 MG PO TABS: 10.0000 mg | ORAL_TABLET | Freq: Every day | ORAL | 1 refills | Status: DC

## 2022-05-27 NOTE — Progress Notes (Signed)
Complete physical exam  Patient: Claudia Joseph   DOB: April 06, 1945   77 y.o. Female  MRN: [id number]  Subjective:    Chief Complaint  Patient presents with   Annual Exam    Claudia Joseph is a 77 y.o. female who presents today for a complete physical exam. She reports consuming a general diet. The patient does not participate in regular exercise at present. She plans on starting to walk for exercise when the pollen improves. She generally feels well. She reports sleeping fair, wakes up freq to urinate . She does not have additional problems to discuss today.   She is try to find a copy of her old Tdap vaccine.  She is that she knows she had a problem.  She is getting the exact year she was able to find out she had a TB test done on October 22, 2016 which was negative.  She did had a full set of labs done in February and the only thing significantly abnormal was an elevated ferritin level but her iron levels were normal.  Her vitamin D was borderline low but she does take a vitamin D supplement.   Most recent fall risk assessment:    05/27/2022    9:49 AM  Fall Risk   Falls in the past year? 0  Number falls in past yr: 0  Injury with Fall? 0  Risk for fall due to : No Fall Risks  Follow up Falls evaluation completed     Most recent depression screenings:    05/27/2022    9:49 AM  PHQ 2/9 Scores  PHQ - 2 Score 0        Patient Care Team: System, Provider Not In as PCP - General Storm Frisk, MD as Attending Physician (Pulmonary Disease) Monica Becton, MD as Consulting Physician (Sports Medicine)   Outpatient Medications Prior to Visit  Medication Sig   albuterol (PROVENTIL) (2.5 MG/3ML) 0.083% nebulizer solution Take 3 mLs (2.5 mg total) by nebulization every 6 (six) hours as needed for wheezing or shortness of breath.   albuterol (VENTOLIN HFA) 108 (90 Base) MCG/ACT inhaler Inhale 2 puffs into the lungs every 6 (six) hours as needed for wheezing or shortness of  breath.   aspirin EC 81 MG tablet Take 81 mg by mouth daily. Swallow whole.   cholecalciferol (VITAMIN D) 1000 units tablet Take 1,000 Units by mouth daily.   [DISCONTINUED] amLODipine (NORVASC) 10 MG tablet Take 10 mg by mouth daily.   [DISCONTINUED] metoprolol succinate (TOPROL-XL) 25 MG 24 hr tablet Take 25 mg by mouth daily.   No facility-administered medications prior to visit.    ROS        Objective:     BP 135/65   Pulse 66   Ht 5\' 7"  (1.702 m)   Wt 187 lb (84.8 kg)   SpO2 96%   BMI 29.29 kg/m    Physical Exam Vitals and nursing note reviewed.  Constitutional:      Appearance: She is well-developed.  HENT:     Head: Normocephalic and atraumatic.     Right Ear: External ear normal.     Left Ear: External ear normal.     Nose: Nose normal.  Eyes:     Conjunctiva/sclera: Conjunctivae normal.     Pupils: Pupils are equal, round, and reactive to light.  Neck:     Thyroid: No thyromegaly.  Cardiovascular:     Rate and Rhythm: Normal rate and regular rhythm.  Heart sounds: Normal heart sounds.  Pulmonary:     Effort: Pulmonary effort is normal.     Breath sounds: Normal breath sounds. No wheezing.  Musculoskeletal:     Cervical back: Neck supple.  Lymphadenopathy:     Cervical: No cervical adenopathy.  Skin:    General: Skin is warm and dry.     Coloration: Skin is not pale.  Neurological:     Mental Status: She is alert and oriented to person, place, and time.  Psychiatric:        Behavior: Behavior normal.      No results found for any visits on 05/27/22.     Assessment & Plan:    Routine Health Maintenance and Physical Exam  Immunization History  Administered Date(s) Administered   PPD Test 08/13/2014, 10/20/2016    Health Maintenance  Topic Date Due   Hepatitis C Screening  Never done   DTaP/Tdap/Td (1 - Tdap) Never done   DEXA SCAN  Never done   Medicare Annual Wellness (AWV)  08/30/2021   Zoster Vaccines- Shingrix (1 of 2)  07/16/2022 (Originally 07/14/1964)   Pneumonia Vaccine 77+ Years old (1 of 1 - PCV) 04/15/2023 (Originally 07/13/2008)   COVID-19 Vaccine (1) 06/12/2023 (Originally 07/13/1950)   INFLUENZA VACCINE  08/20/2022   HPV VACCINES  Aged Out    Discussed health benefits of physical activity, and encouraged her to engage in regular exercise appropriate for her age and condition.  Problem List Items Addressed This Visit       Cardiovascular and Mediastinum   Hypertension   Relevant Medications   amLODipine (NORVASC) 10 MG tablet   metoprolol succinate (TOPROL-XL) 25 MG 24 hr tablet   Aortic atherosclerosis (HCC)   Relevant Medications   amLODipine (NORVASC) 10 MG tablet   metoprolol succinate (TOPROL-XL) 25 MG 24 hr tablet   Other Visit Diagnoses     Wellness examination    -  Primary   Encounter for hepatitis C screening test for low risk patient           Keep up a regular exercise program and make sure you are eating a healthy diet Try to eat 4 servings of dairy a day, or if you are lactose intolerant take a calcium with vitamin D daily.  Declines all vaccines   No follow-ups on file.     Nani Gasser, MD

## 2022-05-28 ENCOUNTER — Encounter: Payer: Self-pay | Admitting: *Deleted

## 2022-08-13 DIAGNOSIS — L820 Inflamed seborrheic keratosis: Secondary | ICD-10-CM | POA: Diagnosis not present

## 2022-08-13 DIAGNOSIS — D485 Neoplasm of uncertain behavior of skin: Secondary | ICD-10-CM | POA: Diagnosis not present

## 2022-08-20 DIAGNOSIS — H21233 Degeneration of iris (pigmentary), bilateral: Secondary | ICD-10-CM | POA: Diagnosis not present

## 2022-08-20 DIAGNOSIS — D3132 Benign neoplasm of left choroid: Secondary | ICD-10-CM | POA: Diagnosis not present

## 2022-08-20 DIAGNOSIS — H35033 Hypertensive retinopathy, bilateral: Secondary | ICD-10-CM | POA: Diagnosis not present

## 2022-10-15 DIAGNOSIS — R351 Nocturia: Secondary | ICD-10-CM | POA: Diagnosis not present

## 2022-10-15 DIAGNOSIS — Z1330 Encounter for screening examination for mental health and behavioral disorders, unspecified: Secondary | ICD-10-CM | POA: Diagnosis not present

## 2022-10-15 DIAGNOSIS — R809 Proteinuria, unspecified: Secondary | ICD-10-CM | POA: Diagnosis not present

## 2022-11-16 ENCOUNTER — Other Ambulatory Visit: Payer: Self-pay | Admitting: *Deleted

## 2022-11-16 DIAGNOSIS — R7989 Other specified abnormal findings of blood chemistry: Secondary | ICD-10-CM | POA: Diagnosis not present

## 2022-11-17 ENCOUNTER — Ambulatory Visit: Payer: Medicare Other | Admitting: Adult Health

## 2022-11-17 ENCOUNTER — Encounter: Payer: Self-pay | Admitting: Adult Health

## 2022-11-17 VITALS — BP 120/60 | HR 82 | Temp 98.1°F | Ht 67.5 in | Wt 186.8 lb

## 2022-11-17 DIAGNOSIS — D2330 Other benign neoplasm of skin of unspecified part of face: Secondary | ICD-10-CM | POA: Diagnosis not present

## 2022-11-17 DIAGNOSIS — D1801 Hemangioma of skin and subcutaneous tissue: Secondary | ICD-10-CM | POA: Diagnosis not present

## 2022-11-17 DIAGNOSIS — L570 Actinic keratosis: Secondary | ICD-10-CM | POA: Diagnosis not present

## 2022-11-17 DIAGNOSIS — Z85828 Personal history of other malignant neoplasm of skin: Secondary | ICD-10-CM | POA: Diagnosis not present

## 2022-11-17 DIAGNOSIS — J45909 Unspecified asthma, uncomplicated: Secondary | ICD-10-CM

## 2022-11-17 DIAGNOSIS — U099 Post covid-19 condition, unspecified: Secondary | ICD-10-CM | POA: Diagnosis not present

## 2022-11-17 DIAGNOSIS — J479 Bronchiectasis, uncomplicated: Secondary | ICD-10-CM

## 2022-11-17 DIAGNOSIS — J302 Other seasonal allergic rhinitis: Secondary | ICD-10-CM

## 2022-11-17 DIAGNOSIS — R0683 Snoring: Secondary | ICD-10-CM | POA: Diagnosis not present

## 2022-11-17 DIAGNOSIS — L821 Other seborrheic keratosis: Secondary | ICD-10-CM | POA: Diagnosis not present

## 2022-11-17 LAB — TRANSFERRIN: Transferrin: 249 mg/dL (ref 192–364)

## 2022-11-17 LAB — IRON,TIBC AND FERRITIN PANEL
Ferritin: 172 ng/mL — ABNORMAL HIGH (ref 15–150)
Iron Saturation: 25 % (ref 15–55)
Iron: 76 ug/dL (ref 27–139)
Total Iron Binding Capacity: 304 ug/dL (ref 250–450)
UIBC: 228 ug/dL (ref 118–369)

## 2022-11-17 MED ORDER — FLUTICASONE FUROATE-VILANTEROL 100-25 MCG/ACT IN AEPB: 1.0000 | INHALATION_SPRAY | Freq: Every day | RESPIRATORY_TRACT | 6 refills | Status: DC

## 2022-11-17 NOTE — Progress Notes (Signed)
@Patient  ID: Claudia Joseph, female    DOB: 11-Oct-1945, 77 y.o.   MRN: [id number]  Chief Complaint  Patient presents with   Follow-up    Referring provider: No ref. 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 3 mm left upper lobe nodule unchanged. No new nodule. No axillary or supraclavicular lymphadenopathy. No mediastinal or hilar adenopathy. Nodule stable going back to 10/08/11   High-resolution CT chest October 2019 showed mild bronchiectasis most evident in the right lower and right middle lobes.  Several tiny 2 to 4 mm nodules throughout stable since 2015  Chest xray 01/2022 Clear (Care Everywhere)   CXR 06/2020 Clear    LABS 07/18/15 CBC: 8.1/14.3/42.3/321 Eosinophils: 0.2 IgE: 18 RAST panel: Negative   04/27/11 IgE: 12.8 Hypersensitivity Pneumonitis Panel:  Aspergillus pullulans 1 band detected  11/17/2022 Follow up ; Bronchiectasis and Allergic Rhinitis  Patient presents for a 8-month follow-up.  Patient is followed for mild bronchiectasis noted on CT scan.  She has chronic allergic rhinitis.  She uses Allegra daily . Uses Flonase as needed. She remains on BREO , says she does not take on regular basis.   Says overall breathing is doing okay.  Denies any flare of cough or wheezing.  Denies any increased albuterol use.  Remains very active.  As above chest x-ray January 2024 was clear. Patient declines  vaccines  Did have COVID-19 infection in May 2022 with residual long-haul COVID symptoms with ongoing fatigue and decreased activity tolerance along with brain fog and poor concentration.  She says symptoms have improved over time.  Has gone back to work 1 day a week at the [employer].   Complains of snoring, restless, nocturia.  No significant daytime sleepiness. No naps . No CHF or CVA.  Takes Delta 8 gummie sometimes to help with sleep.  Worried she has sleep apnea.    Allergies  Allergen Reactions   Hydromorphone Hcl Other (See Comments)   Meperidine Hcl     Demerol causes severe HAs   Morphine And Codeine     Causes bad headaches per patient    Immunization History  Administered Date(s) Administered   PPD Test 08/13/2014, 10/20/2016    Past Medical History:  Diagnosis Date   COVID    GERD (gastroesophageal reflux disease)    Head pain, chronic    history of   Hernia    Hypertension     Tobacco History: Social History   Tobacco Use  Smoking Status Never  Smokeless Tobacco Never   Counseling given: Not Answered   Outpatient Medications Prior to Visit  Medication Sig Dispense Refill   albuterol (PROVENTIL) (2.5 MG/3ML) 0.083% nebulizer solution Take 3 mLs (2.5 mg total) by nebulization every 6 (six) hours as needed for wheezing or shortness of breath. 75 mL 12   albuterol (VENTOLIN HFA) 108 (90 Base) MCG/ACT inhaler Inhale 2 puffs into the lungs every 6 (six) hours as needed  for wheezing or shortness of breath. 8.5 each 6   amLODipine (NORVASC) 10 MG tablet Take 1 tablet (10 mg total) by mouth daily. 90 tablet 1   aspirin EC 81 MG tablet Take 81 mg by mouth daily. Swallow whole.     cholecalciferol (VITAMIN D) 1000 units tablet Take 1,000 Units by mouth daily.     metoprolol succinate (TOPROL-XL) 25 MG 24 hr tablet Take 1 tablet (25 mg total) by mouth daily. 90 tablet 1   No facility-administered medications prior to visit.     Review of Systems:    Constitutional:   No  weight loss, night sweats,  Fevers, chills, +fatigue, or  lassitude.  HEENT:   No headaches,  Difficulty swallowing,  Tooth/dental problems, or  Sore throat,                No sneezing, itching, ear ache, +nasal congestion, post nasal drip,   CV:  No chest pain,  Orthopnea, PND, swelling in lower extremities, anasarca, dizziness, palpitations, syncope.   GI  No heartburn, indigestion, abdominal pain, nausea, vomiting, diarrhea, change in bowel habits, loss of appetite, bloody stools.   Resp: No shortness of breath with exertion or at rest.  No excess mucus, no productive cough,  No non-productive cough,  No coughing up of blood.  No change in color of mucus.  No wheezing.  No chest wall deformity  Skin: no rash or lesions.  GU: no dysuria, change in color of urine, no urgency or frequency.  No flank pain, no hematuria   MS:  No joint pain or swelling.  No decreased range of motion.  No back pain.    Physical Exam  BP 120/60 (BP Location: Left Arm, Patient Position: Sitting, Cuff Size: Large)   Pulse 82   Temp 98.1 F (36.7 C) (Oral)   Ht 5' 7.5" (1.715 m)   Wt 186 lb 12.8 oz (84.7 kg)   SpO2 97%   BMI 28.83 kg/m   GEN: A/Ox3; pleasant , NAD, well nourished    HEENT:  [place]/AT,  EACs-clear, TMs-wnl, NOSE-clear, THROAT-clear, no lesions, no postnasal drip or exudate noted.  Class 2 MP airway   NECK:  Supple w/ fair ROM; no JVD; normal carotid impulses w/o bruits; no thyromegaly or nodules palpated; no lymphadenopathy.    RESP  Clear  P & A; w/o, wheezes/ rales/ or rhonchi. no accessory muscle use, no dullness to percussion  CARD:  RRR, no m/r/g, no peripheral edema, pulses intact, no cyanosis or clubbing.  GI:   Soft & nt; nml bowel sounds; no organomegaly or masses detected.   Musco: Warm bil, no deformities or joint swelling noted.   Neuro: alert, no focal deficits noted.    Skin: Warm, no lesions or rashes    Lab Results:  CBC   BNP No  results found for: "BNP"  ProBNP No results found for: "PROBNP"  Imaging: No results found.  Administration History     None          Latest Ref Rng & Units 05/14/2016   11:47 AM  PFT Results  FVC-Pre L 2.92   FVC-Predicted Pre % 89   FVC-Post L 2.83   FVC-Predicted Post % 86   Pre FEV1/FVC % % 83   Post FEV1/FCV % % 89   FEV1-Pre L 2.43   FEV1-Predicted Pre % 98   FEV1-Post L 2.52   DLCO uncorrected ml/min/mmHg 22.61   DLCO UNC% % 79   DLCO  corrected ml/min/mmHg 21.57   DLCO COR %Predicted % 76   DLVA Predicted % 94   TLC L 4.85   TLC % Predicted % 88   RV % Predicted % 83     No results found for: "NITRICOXIDE"      Assessment & Plan:   Bronchiectasis without complication (HCC) Mild Bronchiectasis on CT chest - minimal symptoms Only uses BREO during flares.  Continue with trigger prevention -chronic rhinitis  Check PFT on return  Plan  Patient Instructions  Continue on Breo 1 puff daily, rinse after use.  Albuterol inhaler  or neb as needed   Saline nasal rinses As needed   Saline nasal gel At bedtime   Flonase 2 puffs daily As needed   Allegra 180mg  daily  Set up for home sleep study. ( I will call with results )  Follow with Dr. Tonia Brooms or Eryka Dolinger NP in 6 months with Spirometry with DLCO.  Please contact office for sooner follow up if symptoms do not improve or worsen or seek emergency care     Chronic seasonal allergic rhinitis Continue on Allegra and Flonase    Snoring Snoring , restless sleep -concerning for Sleep apnea  Set up for home sleep study   Plan  Patient Instructions  Continue on Breo 1 puff daily, rinse after use.  Albuterol inhaler  or neb as needed   Saline nasal rinses As needed   Saline nasal gel At bedtime   Flonase 2 puffs daily As needed   Allegra 180mg  daily  Set up for home sleep study. ( I will call with results )  Follow with Dr. Tonia Brooms or Jaesean Litzau NP in 6 months with Spirometry with DLCO.  Please contact  office for sooner follow up if symptoms do not improve or worsen or seek emergency care       Rubye Oaks, NP 11/17/2022

## 2022-11-17 NOTE — Patient Instructions (Addendum)
Continue on Breo 1 puff daily, rinse after use.  Albuterol inhaler  or neb as needed   Saline nasal rinses As needed   Saline nasal gel At bedtime   Flonase 2 puffs daily As needed   Allegra 180mg  daily  Set up for home sleep study. ( I will call with results )  Follow with Dr. Tonia Brooms or Hava Massingale NP in 6 months with Spirometry with DLCO.  Please contact office for sooner follow up if symptoms do not improve or worsen or seek emergency care

## 2022-11-17 NOTE — Progress Notes (Signed)
HI Claudia Joseph, your ferritin is still elevated but lower that 8 months ago so seem to be trending down. Iron levels look good. No sign of iron overload

## 2022-11-17 NOTE — Assessment & Plan Note (Addendum)
Mild Bronchiectasis on CT chest - minimal symptoms Only uses BREO during flares.  Continue with trigger prevention -chronic rhinitis  Check PFT on return  Plan  Patient Instructions  Continue on Breo 1 puff daily, rinse after use.  Albuterol inhaler  or neb as needed   Saline nasal rinses As needed   Saline nasal gel At bedtime   Flonase 2 puffs daily As needed   Allegra 180mg  daily  Set up for home sleep study. ( I will call with results )  Follow with Dr. Tonia Brooms or Katianne Barre NP in 6 months with Spirometry with DLCO.  Please contact office for sooner follow up if symptoms do not improve or worsen or seek emergency care

## 2022-11-17 NOTE — Assessment & Plan Note (Signed)
Snoring , restless sleep -concerning for Sleep apnea  Set up for home sleep study   Plan  Patient Instructions  Continue on Breo 1 puff daily, rinse after use.  Albuterol inhaler  or neb as needed   Saline nasal rinses As needed   Saline nasal gel At bedtime   Flonase 2 puffs daily As needed   Allegra 180mg  daily  Set up for home sleep study. ( I will call with results )  Follow with Dr. Tonia Brooms or Mickel Schreur NP in 6 months with Spirometry with DLCO.  Please contact office for sooner follow up if symptoms do not improve or worsen or seek emergency care

## 2022-11-17 NOTE — Assessment & Plan Note (Signed)
Continue on Allegra and Flonase

## 2022-11-20 NOTE — Addendum Note (Signed)
Addended by: Delrae Rend on: 11/20/2022 04:21 PM   Modules accepted: Orders

## 2022-12-04 ENCOUNTER — Telehealth: Payer: Self-pay | Admitting: Family Medicine

## 2022-12-04 DIAGNOSIS — Z0100 Encounter for examination of eyes and vision without abnormal findings: Secondary | ICD-10-CM

## 2022-12-04 DIAGNOSIS — H579 Unspecified disorder of eye and adnexa: Secondary | ICD-10-CM

## 2022-12-04 NOTE — Telephone Encounter (Signed)
Patient called I stating that she is changing her insurance and is now needing a referral for [facility], are we able to send a referral? She states that she has been going here for some years now.

## 2022-12-04 NOTE — Telephone Encounter (Signed)
Yes ok for rferral

## 2022-12-07 NOTE — Telephone Encounter (Signed)
Referral placed. Routing to [place].

## 2022-12-08 NOTE — Telephone Encounter (Signed)
Referral, clinical notes, demographics and copies of insurance cards have been faxed to [facility] at [phone]. Office will contact patient to schedule referral appointment.    Pt aware that referral has been sent. Pt states her insurance will change to [employer] in January 2025. Explained to patient that referral could be sent but depending on 2025 insurance, it may require a Prior Auth. Checked [employer] website and both eye Physicians - Dr. Lenord Fellers and Dr. Unice Bailey are in network with [employer].

## 2022-12-23 ENCOUNTER — Ambulatory Visit (INDEPENDENT_AMBULATORY_CARE_PROVIDER_SITE_OTHER): Payer: Medicare Other | Admitting: Family Medicine

## 2022-12-23 ENCOUNTER — Encounter: Payer: Self-pay | Admitting: Family Medicine

## 2022-12-23 VITALS — BP 140/62 | HR 67

## 2022-12-23 DIAGNOSIS — Z636 Dependent relative needing care at home: Secondary | ICD-10-CM | POA: Diagnosis not present

## 2022-12-23 DIAGNOSIS — I10 Essential (primary) hypertension: Secondary | ICD-10-CM

## 2022-12-23 NOTE — Assessment & Plan Note (Signed)
Blood pressure is little elevated today even on repeat.  Normally blood pressures in the 120s.  It looks great 2 months ago and she checks it at home.  Going to see her back in 6 weeks we will recheck it at that time.

## 2022-12-23 NOTE — Assessment & Plan Note (Signed)
We discussed options that might be helpful for her she is not interested in medication at this point in time.  We discussed just continuing to work on lifestyle eating healthy and eating regularly.  Right now she is usually only eating 1 meal a day so encouraged her to try to get some healthy snacks send to help keep her energy up.  Also working on trying to get good sleep quality as best she can trying to stay active and exercising.  We also discussed getting in with a therapist that might be able to help her come up with some strategies to reduce her stress.  I also think a support group would be really helpful and she was open to that as well.  Also gave her a book called the 36-hour day and encouraged her to read some of the chapters relating to her husband's condition that might be helpful for her moving forward.

## 2022-12-23 NOTE — Progress Notes (Signed)
Established Patient Office Visit  Subjective   Patient ID: Claudia Joseph, female    DOB: [birth year]  Age: 77 y.o. MRN: [id number]  Chief Complaint  Patient presents with   mood    HPI  She is here today to discuss mood.  She has been under a lot of stress particularly over this last year as her husband has had more difficulty with his memory he has a current diagnosis of cognitive impairment but she feels like maybe it is more advanced than what her providers know.  She says that he has been getting very irritable and swearing at times which is not like him or his personality.  She says they have been together for 60 years.  Has not had any problems driving thus far but she says he did go to the bank a couple of weeks ago and pulled out $400 and then did not know what he did with it.  Says most of the time he just sits in the house and stares out the window and does not really do anything.  She says he used to make furniture but got confused trying to assemble a cabinet that she had purchased and actually drilled holes in it.  He says her husband wants to constantly be around her and next to her.  Has 3 sons and she feels like they are all in denial about their dad's dementia.  They think it is just normal part of getting old.  She does have some friends that are supportive.  She is working 1 day a week with [employer] and she says that she really enjoys that and it also allows her to get out of the house.    ROS    Objective:     BP (!) 140/62   Pulse 67   SpO2 94%    Physical Exam Vitals reviewed.  Constitutional:      Appearance: Normal appearance.  HENT:     Head: Normocephalic.  Pulmonary:     Effort: Pulmonary effort is normal.  Neurological:     Mental Status: She is alert and oriented to person, place, and time.  Psychiatric:        Mood and Affect: Mood normal.        Behavior: Behavior normal.      No results found for any visits on 12/23/22.    The ASCVD  Risk score (Arnett DK, et al., 2019) failed to calculate for the following reasons:   Cannot find a previous HDL lab    Assessment & Plan:   Problem List Items Addressed This Visit       Cardiovascular and Mediastinum   Hypertension    Blood pressure is little elevated today even on repeat.  Normally blood pressures in the 120s.  It looks great 2 months ago and she checks it at home.  Going to see her back in 6 weeks we will recheck it at that time.        Other   Caregiver stress - Primary    We discussed options that might be helpful for her she is not interested in medication at this point in time.  We discussed just continuing to work on lifestyle eating healthy and eating regularly.  Right now she is usually only eating 1 meal a day so encouraged her to try to get some healthy snacks send to help keep her energy up.  Also working on trying to get good sleep quality  as best she can trying to stay active and exercising.  We also discussed getting in with a therapist that might be able to help her come up with some strategies to reduce her stress.  I also think a support group would be really helpful and she was open to that as well.  Also gave her a book called the 36-hour day and encouraged her to read some of the chapters relating to her husband's condition that might be helpful for her moving forward.      Relevant Orders   Ambulatory referral to Behavioral Health    Return in about 6 weeks (around 02/03/2023) for Check on STress levels. Nani Gasser, MD

## 2022-12-27 ENCOUNTER — Other Ambulatory Visit: Payer: Self-pay | Admitting: Family Medicine

## 2023-02-03 ENCOUNTER — Telehealth: Payer: Self-pay | Admitting: Family Medicine

## 2023-02-03 ENCOUNTER — Ambulatory Visit (INDEPENDENT_AMBULATORY_CARE_PROVIDER_SITE_OTHER): Payer: Medicare Other | Admitting: Family Medicine

## 2023-02-03 ENCOUNTER — Encounter: Payer: Self-pay | Admitting: Family Medicine

## 2023-02-03 VITALS — BP 135/60 | HR 65 | Ht 67.0 in | Wt 183.0 lb

## 2023-02-03 DIAGNOSIS — I10 Essential (primary) hypertension: Secondary | ICD-10-CM | POA: Diagnosis not present

## 2023-02-03 DIAGNOSIS — J479 Bronchiectasis, uncomplicated: Secondary | ICD-10-CM | POA: Diagnosis not present

## 2023-02-03 DIAGNOSIS — Z636 Dependent relative needing care at home: Secondary | ICD-10-CM | POA: Diagnosis not present

## 2023-02-03 MED ORDER — AMOXICILLIN-POT CLAVULANATE 875-125 MG PO TABS: 1.0000 | ORAL_TABLET | Freq: Two times a day (BID) | ORAL | 0 refills | Status: DC

## 2023-02-03 NOTE — Assessment & Plan Note (Signed)
 Well controlled. Continue current regimen. Follow up in  6 mo

## 2023-02-03 NOTE — Telephone Encounter (Signed)
 Please call  [facility] and see fi we can get a copy of the new patient paperwork.

## 2023-02-03 NOTE — Assessment & Plan Note (Signed)
 Head and treat with Augmentin .  If not better after the weekend please let us  know and we can either consider getting a chest x-ray or have her follow-up with pulmonology.  No wheezing on exam.

## 2023-02-03 NOTE — Progress Notes (Signed)
 Established Patient Office Visit  Subjective  Patient ID: Claudia Joseph, female    DOB: [birth year]  Age: 78 y.o. MRN: [id number]  Chief Complaint  Patient presents with   caregiver stress    HPI F/U elevated BP - she is doing well. Says home BPs have been great!!    Caregiver stress-she said she did get notification from behavioral health but she does not use email so they were not able to get her the new patient forms.  She still feels like she is interested in like it would be helpful she definitely does not want to take a pill or medication to help with her stress levels.  Unfortunately she does feel like her husband's dementia is gradually getting worse she did go to his last doctor's appointment with him.  He evidently refuses to believe that he has dementia.  She is reading the 64 hour-day book   She also reports that she has had a cough and congestion for about 3 to 4 weeks.  She was treated with a Z-Pak and says she just really did not get a lot of better.  She still feels like there is a lot of congestion and mucus in her throat and chest.  She did have some sinus congestion initially and that is better.  No fever or chills.  She does have a history of bronchiectasis.     ROS    Objective:     BP 135/60   Pulse 65   Ht 5\' 7"  (1.702 m)   Wt 183 lb (83 kg)   SpO2 96%   BMI 28.66 kg/m    Physical Exam Vitals and nursing note reviewed.  Constitutional:      Appearance: Normal appearance.  HENT:     Head: Normocephalic and atraumatic.     Right Ear: Tympanic membrane, ear canal and external ear normal. There is no impacted cerumen.     Left Ear: Tympanic membrane, ear canal and external ear normal. There is no impacted cerumen.     Nose: Nose normal.     Mouth/Throat:     Pharynx: Oropharynx is clear.  Eyes:     Conjunctiva/sclera: Conjunctivae normal.  Cardiovascular:     Rate and Rhythm: Normal rate and regular rhythm.  Pulmonary:     Effort: Pulmonary  effort is normal.     Breath sounds: Normal breath sounds.  Musculoskeletal:     Cervical back: Neck supple. No tenderness.  Lymphadenopathy:     Cervical: No cervical adenopathy.  Skin:    General: Skin is warm and dry.  Neurological:     Mental Status: She is alert and oriented to person, place, and time.  Psychiatric:        Mood and Affect: Mood normal.      No results found for any visits on 02/03/23.    The ASCVD Risk score (Arnett DK, et al., 2019) failed to calculate for the following reasons:   Cannot find a previous HDL lab    Assessment & Plan:   Problem List Items Addressed This Visit       Cardiovascular and Mediastinum   Hypertension   Well controlled. Continue current regimen. Follow up in 6 mo.          Respiratory   Bronchiectasis without complication (HCC)   Head and treat with Augmentin .  If not better after the weekend please let us  know and we can either consider getting a chest x-ray or have  her follow-up with pulmonology.  No wheezing on exam.        Other   Caregiver stress - Primary   Talk to behavioral health to see if we can get the new patient forms for her since she does not use email because I still think it would be incredibly helpful for her.       Return in about 3 months (around 05/04/2023) for Mood.    Duaine German, MD

## 2023-02-03 NOTE — Telephone Encounter (Signed)
 Pt called back and I gave her [facility] phone # [phone] so that she can call I did call their office and transferred her while on the phone to help facilitate this.

## 2023-02-03 NOTE — Telephone Encounter (Signed)
 Called and was told that pt was called multiple times and last time they reached out to the pt and lvm on 12/24. She hasn't returned their calls or emails.   I called the patient and did not get an answer asked that she call me and also sent a mychart hoping that she responds so that I can forward their information to her so that she can call their office to get the new patient forms and to  be scheduled.

## 2023-02-03 NOTE — Assessment & Plan Note (Signed)
 Talk to behavioral health to see if we can get the new patient forms for her since she does not use email because I still think it would be incredibly helpful for her.

## 2023-03-06 ENCOUNTER — Encounter: Payer: Self-pay | Admitting: Family Medicine

## 2023-03-06 DIAGNOSIS — H35033 Hypertensive retinopathy, bilateral: Secondary | ICD-10-CM

## 2023-03-08 NOTE — Telephone Encounter (Signed)
OK to place referral

## 2023-03-15 DIAGNOSIS — H21233 Degeneration of iris (pigmentary), bilateral: Secondary | ICD-10-CM | POA: Diagnosis not present

## 2023-03-23 ENCOUNTER — Ambulatory Visit (INDEPENDENT_AMBULATORY_CARE_PROVIDER_SITE_OTHER): Admitting: Family Medicine

## 2023-03-23 ENCOUNTER — Encounter: Payer: Self-pay | Admitting: Family Medicine

## 2023-03-23 VITALS — BP 170/61 | HR 82 | Ht 67.5 in | Wt 182.0 lb

## 2023-03-23 DIAGNOSIS — L02214 Cutaneous abscess of groin: Secondary | ICD-10-CM | POA: Diagnosis not present

## 2023-03-23 DIAGNOSIS — Z636 Dependent relative needing care at home: Secondary | ICD-10-CM | POA: Diagnosis not present

## 2023-03-23 MED ORDER — MUPIROCIN 2 % EX OINT: TOPICAL_OINTMENT | Freq: Two times a day (BID) | CUTANEOUS | 0 refills | Status: DC

## 2023-03-23 MED ORDER — DOXYCYCLINE HYCLATE 100 MG PO TABS: 100.0000 mg | ORAL_TABLET | Freq: Two times a day (BID) | ORAL | 0 refills | Status: DC

## 2023-03-23 NOTE — Assessment & Plan Note (Signed)
 This is discussed therapy/counseling.  She is actually gena get her husband in with the memory clinic of her [facility] and they have some resources for caregivers there.  She says she is thinking about considering a medication to help her but is not sure if she said she will reach out to me through MyChart if she decides she wants to start an SSRI.

## 2023-03-23 NOTE — Progress Notes (Signed)
   Acute Office Visit  Subjective:     Patient ID: Claudia Joseph, female    DOB: January 13, 1946, 78 y.o.   MRN: [id number]  No chief complaint on file.   HPI Patient is in today for boil on groin area.  First one popped up a week ago after she had had a stool incontinence episode.  It showed up a couple days later and she thought maybe was just from irritation.  She had some old amoxicillin leftover and took it and said that 1 started to dry up and clear up but since then she has developed 3 new lesions.  The initial 1 did drain some.  She has been doing some Epsom salt soaks.     ROS      Objective:    BP (!) 170/61   Pulse 82   Ht 5' 7.5" (1.715 m)   Wt 182 lb (82.6 kg)   BMI 28.08 kg/m    Physical Exam Abdominal:    Genitourinary:      Comments: Areas of papular erythema where the superficial skin looks excoriated.  No active drainage.  Some mild swelling but no induration.    No results found for any visits on 03/23/23.      Assessment & Plan:   Problem List Items Addressed This Visit       Other   Caregiver stress   This is discussed therapy/counseling.  She is actually gena get her husband in with the memory clinic of her [facility] and they have some resources for caregivers there.  She says she is thinking about considering a medication to help her but is not sure if she said she will reach out to me through MyChart if she decides she wants to start an SSRI.      Other Visit Diagnoses       Abscess, groin    -  Primary      She has multiple erythematous areas nothing with active drainage that needs to be lanced today.  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  Again no active drainage to culture I am also going to have her apply mupirocin ointment twice a day until the areas are cleared up make sure washing underwear and clothing in  hot water.  Call if any new lesions are recurring.   Meds ordered this encounter  Medications   doxycycline (VIBRA-TABS) 100 MG tablet    Sig: Take 1 tablet (100 mg total) by mouth 2 (two) times daily.    Dispense:  14 tablet    Refill:  0   mupirocin ointment (BACTROBAN) 2 %    Sig: Apply topically 2 (two) times daily. X 10 days    Dispense:  30 g    Refill:  0    No follow-ups on file.  Nani Gasser, MD

## 2023-05-05 ENCOUNTER — Ambulatory Visit: Payer: Self-pay | Admitting: Family Medicine

## 2023-05-28 ENCOUNTER — Encounter: Payer: Self-pay | Admitting: Family Medicine

## 2023-05-28 ENCOUNTER — Ambulatory Visit (INDEPENDENT_AMBULATORY_CARE_PROVIDER_SITE_OTHER): Admitting: Family Medicine

## 2023-05-28 VITALS — BP 104/77 | HR 70 | Temp 98.6°F | Ht 67.0 in | Wt 181.0 lb

## 2023-05-28 DIAGNOSIS — I10 Essential (primary) hypertension: Secondary | ICD-10-CM

## 2023-05-28 DIAGNOSIS — I700 Atherosclerosis of aorta: Secondary | ICD-10-CM

## 2023-05-28 DIAGNOSIS — Z0000 Encounter for general adult medical examination without abnormal findings: Secondary | ICD-10-CM | POA: Diagnosis not present

## 2023-05-28 DIAGNOSIS — R7989 Other specified abnormal findings of blood chemistry: Secondary | ICD-10-CM

## 2023-05-28 MED ORDER — AMLODIPINE BESYLATE 10 MG PO TABS: 10.0000 mg | ORAL_TABLET | Freq: Every day | ORAL | 1 refills | Status: DC

## 2023-05-28 MED ORDER — METOPROLOL SUCCINATE ER 25 MG PO TB24: 25.0000 mg | ORAL_TABLET | Freq: Every day | ORAL | 1 refills | Status: DC

## 2023-05-28 NOTE — Progress Notes (Addendum)
 Complete physical exam  Patient: Claudia Joseph   DOB: [birth year]   78 y.o. Female  MRN: [id number]  Subjective:     Chief Complaint  Patient presents with   Annual Exam    Fasting labs    KALLISTA LACY is a 78 y.o. female who presents today for a complete physical exam. She reports consuming a general diet. Planning on going back to [employer] for exercise.  She generally feels well. She reports sleeping fair. She does not have additional problems to discuss today. She says she decided to cancel the referral for a therapist.  She feels like she is doing better. Her husband has appt at the [facility] at [facility] next month.    Most recent fall risk assessment:    02/03/2023   10:38 AM  Fall Risk   Falls in the past year? 1  Number falls in past yr: 0  Injury with Fall? 0  Risk for fall due to : No Fall Risks  Follow up Falls evaluation completed     Most recent depression screenings:    02/03/2023   10:38 AM 05/27/2022    9:49 AM  PHQ 2/9 Scores  PHQ - 2 Score 1 0  PHQ- 9 Score 2          Patient Care Team: Cydney Draft, MD as PCP - General (Family Medicine) Vernell Goldsmith, MD as Attending Physician (Pulmonary Disease) Gean Keels, MD as Consulting Physician (Sports Medicine)   Outpatient Medications Prior to Visit  Medication Sig   albuterol  (PROVENTIL ) (2.5 MG/3ML) 0.083% nebulizer solution Take 3 mLs (2.5 mg total) by nebulization every 6 (six) hours as needed for wheezing or shortness of breath.   albuterol  (VENTOLIN  HFA) 108 (90 Base) MCG/ACT inhaler Inhale 2 puffs into the lungs every 6 (six) hours as needed for wheezing or shortness of breath.   aspirin EC 81 MG tablet Take 81 mg by mouth daily. Swallow whole.   cholecalciferol (VITAMIN D ) 1000 units tablet Take 1,000 Units by mouth daily.   [DISCONTINUED] amLODipine  (NORVASC ) 10 MG tablet Take 1 tablet (10 mg total) by mouth daily.   [DISCONTINUED] doxycycline  (VIBRA -TABS)  100 MG tablet Take 1 tablet (100 mg total) by mouth 2 (two) times daily.   [DISCONTINUED] metoprolol  succinate (TOPROL -XL) 25 MG 24 hr tablet Take 1 tablet (25 mg total) by mouth daily.   [DISCONTINUED] mupirocin  ointment (BACTROBAN ) 2 % Apply topically 2 (two) times daily. X 10 days   No facility-administered medications prior to visit.    ROS        Objective:     BP 104/77   Pulse 70   Temp 98.6 F (37 C) (Oral)   Ht 5\' 7"  (1.702 m)   Wt 181 lb (82.1 kg)   SpO2 98%   BMI 28.35 kg/m     Physical Exam Vitals and nursing note reviewed.  Constitutional:      Appearance: Normal appearance.  HENT:     Head: Normocephalic and atraumatic.     Right Ear: Tympanic membrane, ear canal and external ear normal.     Left Ear: Tympanic membrane, ear canal and external ear normal.     Nose: Nose normal.     Mouth/Throat:     Pharynx: Oropharynx is clear.  Eyes:     Extraocular Movements: Extraocular movements intact.     Conjunctiva/sclera: Conjunctivae normal.     Pupils: Pupils are equal, round, and reactive  to light.  Neck:     Thyroid : No thyromegaly.  Cardiovascular:     Rate and Rhythm: Normal rate and regular rhythm.  Pulmonary:     Effort: Pulmonary effort is normal.     Breath sounds: Normal breath sounds.  Abdominal:     General: Bowel sounds are normal.     Palpations: Abdomen is soft.     Tenderness: There is no abdominal tenderness.  Musculoskeletal:        General: No swelling.     Cervical back: Neck supple.  Skin:    General: Skin is warm and dry.  Neurological:     Mental Status: She is alert and oriented to person, place, and time.  Psychiatric:        Mood and Affect: Mood normal.        Behavior: Behavior normal.      No results found for any visits on 05/28/23.      Assessment & Plan:    Routine Health Maintenance and Physical Exam  Immunization History  Administered Date(s) Administered   PPD Test 08/13/2014, 10/20/2016    Health  Maintenance  Topic Date Due   Hepatitis C Screening  Never done   DTaP/Tdap/Td (1 - Tdap) Never done   Pneumonia Vaccine 32+ Years old (1 of 2 - PCV) Never done   Zoster Vaccines- Shingrix (1 of 2) Never done   DEXA SCAN  Never done   Medicare Annual Wellness (AWV)  08/30/2021   COVID-19 Vaccine (1) 06/12/2023 (Originally 07/13/1950)   INFLUENZA VACCINE  08/20/2023   HPV VACCINES  Aged Out   Meningococcal B Vaccine  Aged Out   Colonoscopy  Discontinued    Discussed health benefits of physical activity, and encouraged her to engage in regular exercise appropriate for her age and condition.  Problem List Items Addressed This Visit       Cardiovascular and Mediastinum   Hypertension   Relevant Medications   amLODipine  (NORVASC ) 10 MG tablet   metoprolol  succinate (TOPROL -XL) 25 MG 24 hr tablet   Other Relevant Orders   CMP14+EGFR   Lipid panel   CBC   VITAMIN D  25 Hydroxy (Vit-D Deficiency, Fractures)   Aortic atherosclerosis (HCC)   Relevant Medications   amLODipine  (NORVASC ) 10 MG tablet   metoprolol  succinate (TOPROL -XL) 25 MG 24 hr tablet   Other Relevant Orders   CMP14+EGFR   Lipid panel   CBC   VITAMIN D  25 Hydroxy (Vit-D Deficiency, Fractures)   Other Visit Diagnoses       Wellness examination    -  Primary     Low vitamin D  level       Relevant Orders   VITAMIN D  25 Hydroxy (Vit-D Deficiency, Fractures)       Keep up a regular exercise program and make sure you are eating a healthy diet Try to eat 4 servings of dairy a day, or if you are lactose intolerant take a calcium with vitamin D  daily.  Declines all vaccines today Encouraged her to consider a DEXA. She does take a bone support tab.    Return in about 6 months (around 11/28/2023) for Hypertension.     Duaine German, MD

## 2023-05-29 LAB — LIPID PANEL
Chol/HDL Ratio: 3.7 ratio (ref 0.0–4.4)
Cholesterol, Total: 159 mg/dL (ref 100–199)
HDL: 43 mg/dL (ref >39)
LDL Chol Calc (NIH): 97 mg/dL (ref 0–99)
Triglycerides: 101 mg/dL (ref 0–149)
VLDL Cholesterol Cal: 19 mg/dL (ref 5–40)

## 2023-05-29 LAB — CBC
Hematocrit: 45.9 % (ref 34.0–46.6)
Hemoglobin: 14.4 g/dL (ref 11.1–15.9)
MCH: 29.6 pg (ref 26.6–33.0)
MCHC: 31.4 g/dL — ABNORMAL LOW (ref 31.5–35.7)
MCV: 94 fL (ref 79–97)
Platelets: 320 10*3/uL (ref 150–450)
RBC: 4.86 x10E6/uL (ref 3.77–5.28)
RDW: 12.3 % (ref 11.7–15.4)
WBC: 6.5 10*3/uL (ref 3.4–10.8)

## 2023-05-29 LAB — CMP14+EGFR
ALT: 23 IU/L (ref 0–32)
AST: 26 IU/L (ref 0–40)
Albumin: 4.4 g/dL (ref 3.8–4.8)
Alkaline Phosphatase: 116 IU/L (ref 44–121)
BUN/Creatinine Ratio: 19 (ref 12–28)
BUN: 15 mg/dL (ref 8–27)
Bilirubin Total: 0.3 mg/dL (ref 0.0–1.2)
CO2: 19 mmol/L — ABNORMAL LOW (ref 20–29)
Calcium: 9.7 mg/dL (ref 8.7–10.3)
Chloride: 105 mmol/L (ref 96–106)
Creatinine, Ser: 0.79 mg/dL (ref 0.57–1.00)
Globulin, Total: 2.3 g/dL (ref 1.5–4.5)
Glucose: 87 mg/dL (ref 70–99)
Potassium: 3.8 mmol/L (ref 3.5–5.2)
Sodium: 142 mmol/L (ref 134–144)
Total Protein: 6.7 g/dL (ref 6.0–8.5)
eGFR: 76 mL/min/{1.73_m2} (ref >59)

## 2023-05-29 LAB — VITAMIN D 25 HYDROXY (VIT D DEFICIENCY, FRACTURES): Vit D, 25-Hydroxy: 29.9 ng/mL — ABNORMAL LOW (ref 30.0–100.0)

## 2023-05-30 ENCOUNTER — Encounter: Payer: Self-pay | Admitting: Family Medicine

## 2023-05-30 NOTE — Progress Notes (Signed)
 Hi Fallen our labs look good overall except your vitamin D  is low. Recommend take 25 mcg daily. This can be found over the counter.

## 2023-06-01 ENCOUNTER — Encounter: Payer: Medicare Other | Admitting: Family Medicine

## 2023-07-13 ENCOUNTER — Encounter: Payer: Self-pay | Admitting: Family Medicine

## 2023-07-13 ENCOUNTER — Ambulatory Visit (INDEPENDENT_AMBULATORY_CARE_PROVIDER_SITE_OTHER): Admitting: Family Medicine

## 2023-07-13 VITALS — BP 120/48 | HR 79 | Ht 67.0 in | Wt 179.0 lb

## 2023-07-13 DIAGNOSIS — L608 Other nail disorders: Secondary | ICD-10-CM

## 2023-07-13 DIAGNOSIS — R252 Cramp and spasm: Secondary | ICD-10-CM

## 2023-07-13 MED ORDER — MUPIROCIN 2 % EX OINT: TOPICAL_OINTMENT | Freq: Two times a day (BID) | CUTANEOUS | 0 refills | Status: DC

## 2023-07-13 MED ORDER — DOXYCYCLINE HYCLATE 100 MG PO TABS: 100.0000 mg | ORAL_TABLET | Freq: Two times a day (BID) | ORAL | 0 refills | Status: DC

## 2023-07-13 NOTE — Progress Notes (Signed)
 Acute Office Visit  Subjective:     Patient ID: Claudia Joseph, female    DOB: 09-Jul-1945, 78 y.o.   MRN: [id number]  Chief Complaint  Patient presents with   Rash    Pt reports that this is the same area that she has been seen for previously on her abdomen. She thinks it may be stress causing the area to flare up    HPI Patient is in today for rash on stomach.   Pt reports that this is the same area that she has been seen for previously on her groin. . She thinks it may be stress causing the area to flare up. Getting new lesion on the mons pubis.  She come in with similar symptoms back in March and we treated with doxycycline  and she says within 2 days it completely cleared up.  Current spot has been aggravated x 2 days.   She also says that her nailbeds look really pale.  She noticed that they just look very white.  She says last night she had a pretty severe twisting cramp in her right lower leg she said she used to have problems with her potassium being low and wonders if it could be off again.  ROS      Objective:    BP (!) 120/48   Pulse 79   Ht 5' 7 (1.702 m)   Wt 179 lb 0.6 oz (81.2 kg)   SpO2 95%   BMI 28.04 kg/m    Physical Exam Vitals reviewed.  Constitutional:      Appearance: Normal appearance.  HENT:     Head: Normocephalic.  Pulmonary:     Effort: Pulmonary effort is normal.   Skin:    Comments: The mons pubis she has a small erythematous area maybe half a centimeter in size.  No active drainage.   Neurological:     Mental Status: She is alert and oriented to person, place, and time.   Psychiatric:        Mood and Affect: Mood normal.        Behavior: Behavior normal.     No results found for any visits on 07/13/23.      Assessment & Plan:   Problem List Items Addressed This Visit   None Visit Diagnoses       Discoloration of nailbeds    -  Primary   Relevant Orders   CBC with Differential/Platelet   CMP14+EGFR     Leg cramp        Relevant Orders   CBC with Differential/Platelet   CMP14+EGFR      Does not appear to be an abscess like last time as it is just maybe a irritated follicle.  Recommend topical mupirocin  ointment and if getting worse or not improving okay to start oral doxycycline  she is leaving town tomorrow so I did encourage her to pick it up and take it with her just in case.  Pale nailbeds-will check a CBC to evaluate for anemia.  Muscle cramping-will recheck potassium I believe last time we checked it looked good but she actually was on potassium for years before she became a patient here.  Meds ordered this encounter  Medications   doxycycline  (VIBRA -TABS) 100 MG tablet    Sig: Take 1 tablet (100 mg total) by mouth 2 (two) times daily.    Dispense:  14 tablet    Refill:  0   mupirocin  ointment (BACTROBAN ) 2 %    Sig: Apply  topically 2 (two) times daily.    Dispense:  30 g    Refill:  0    No follow-ups on file.  Dorothyann Byars, MD

## 2023-07-14 ENCOUNTER — Ambulatory Visit: Payer: Self-pay | Admitting: Family Medicine

## 2023-07-14 LAB — CBC WITH DIFFERENTIAL/PLATELET
Basophils Absolute: 0.1 10*3/uL (ref 0.0–0.2)
Basos: 1 %
EOS (ABSOLUTE): 0.2 10*3/uL (ref 0.0–0.4)
Eos: 2 %
Hematocrit: 42.1 % (ref 34.0–46.6)
Hemoglobin: 14 g/dL (ref 11.1–15.9)
Immature Grans (Abs): 0 10*3/uL (ref 0.0–0.1)
Immature Granulocytes: 0 %
Lymphocytes Absolute: 3.3 10*3/uL — ABNORMAL HIGH (ref 0.7–3.1)
Lymphs: 46 %
MCH: 31.6 pg (ref 26.6–33.0)
MCHC: 33.3 g/dL (ref 31.5–35.7)
MCV: 95 fL (ref 79–97)
Monocytes Absolute: 0.5 10*3/uL (ref 0.1–0.9)
Monocytes: 7 %
Neutrophils Absolute: 3.2 10*3/uL (ref 1.4–7.0)
Neutrophils: 44 %
Platelets: 336 10*3/uL (ref 150–450)
RBC: 4.43 x10E6/uL (ref 3.77–5.28)
RDW: 12.6 % (ref 11.7–15.4)
WBC: 7.3 10*3/uL (ref 3.4–10.8)

## 2023-07-14 LAB — CMP14+EGFR
ALT: 21 IU/L (ref 0–32)
AST: 27 IU/L (ref 0–40)
Albumin: 4.4 g/dL (ref 3.8–4.8)
Alkaline Phosphatase: 117 IU/L (ref 44–121)
BUN/Creatinine Ratio: 16 (ref 12–28)
BUN: 17 mg/dL (ref 8–27)
Bilirubin Total: 0.6 mg/dL (ref 0.0–1.2)
CO2: 18 mmol/L — ABNORMAL LOW (ref 20–29)
Calcium: 10.2 mg/dL (ref 8.7–10.3)
Chloride: 104 mmol/L (ref 96–106)
Creatinine, Ser: 1.08 mg/dL — ABNORMAL HIGH (ref 0.57–1.00)
Globulin, Total: 2.4 g/dL (ref 1.5–4.5)
Glucose: 108 mg/dL — ABNORMAL HIGH (ref 70–99)
Potassium: 4.1 mmol/L (ref 3.5–5.2)
Sodium: 140 mmol/L (ref 134–144)
Total Protein: 6.8 g/dL (ref 6.0–8.5)
eGFR: 52 mL/min/{1.73_m2} — ABNORMAL LOW (ref >59)

## 2023-07-14 NOTE — Progress Notes (Signed)
 Hi Libby, kidney function up just a little bit compared to a month ago but it looks like it is close to your baseline which is usually around 0.9.  Liver function looks great.  And blood count is normal.

## 2023-07-19 DIAGNOSIS — M47816 Spondylosis without myelopathy or radiculopathy, lumbar region: Secondary | ICD-10-CM | POA: Diagnosis not present

## 2023-07-19 DIAGNOSIS — M51361 Other intervertebral disc degeneration, lumbar region with lower extremity pain only: Secondary | ICD-10-CM | POA: Diagnosis not present

## 2023-07-19 DIAGNOSIS — R102 Pelvic and perineal pain: Secondary | ICD-10-CM | POA: Diagnosis not present

## 2023-07-19 DIAGNOSIS — R52 Pain, unspecified: Secondary | ICD-10-CM | POA: Diagnosis not present

## 2023-07-26 ENCOUNTER — Ambulatory Visit: Payer: Self-pay | Admitting: *Deleted

## 2023-07-26 DIAGNOSIS — M79604 Pain in right leg: Secondary | ICD-10-CM | POA: Diagnosis not present

## 2023-07-26 NOTE — Telephone Encounter (Signed)
 Copied from CRM [id number]. Topic: Clinical - Red Word Triage >> Jul 26, 2023  8:44 AM Claudia Joseph ORN wrote: Red Word that prompted transfer to Nurse Triage: Patient states her right leg is killing her. States it has been painful for about 2 weeks. Thinks it may be a cyst since Dr. ONEIDA has treated her before for this. Wants to see Dr. ONEIDA. Reason for Disposition  [1] SEVERE pain (e.g., excruciating, unable to do any normal activities) AND [2] not improved after 2 hours of pain medicine  Answer Assessment - Initial Assessment Questions 1. ONSET: When did the pain start?      A week ago.  I need to see Dr. ONEIDA.   I'm in so much pain. I have a pinched nerve in my back.   Sometimes I can walk and other times I can't. 2. LOCATION: Where is the pain located?      I'm having pain in my right leg.  It's in the calf of my leg. Dr. ONEIDA. Can figure it out.  My calf is not red.   It feels like it's going to break in two.  It's not warmer than other leg.    It feels like the bone is hurting on the side of my leg.   This was bothering me before my trip a week or so ago.   I've had a Baker's cyst before.   This does not feel like that.   It's on the side of my calf. 3. PAIN: How bad is the pain?    (Scale 1-10; or mild, moderate, severe)   -  MILD (1-3): doesn't interfere with normal activities    -  MODERATE (4-7): interferes with normal activities (e.g., work or school) or awakens from sleep, limping    -  SEVERE (8-10): excruciating pain, unable to do any normal activities, unable to walk     Severe 4. WORK OR EXERCISE: Has there been any recent work or exercise that involved this part of the body?      No injuries or falls. 5. CAUSE: What do you think is causing the leg pain?     Maybe a cyst. 6. OTHER SYMPTOMS: Do you have any other symptoms? (e.g., chest pain, back pain, breathing difficulty, swelling, rash, fever, numbness, weakness)     See bove 7. PREGNANCY: Is there any chance you are pregnant?  When was your last menstrual period?     N/A  Protocols used: Leg Pain-A-AH FYI Only or Action Required?: FYI only for provider.  Patient was last seen in primary care on 07/13/2023 by Alvan Dorothyann BIRCH, MD. Called Nurse Triage reporting Leg Pain. Symptoms began about a month ago. Interventions attempted: Prescription medications: rx medication. Symptoms are: gradually worsening.  Triage Disposition: See HCP Within 4 Hours (Or PCP Triage)  Patient/caregiver understands and will follow disposition?: Yes The line got disconnected when I called the practice.  She is requesting to see Dr. ONEIDA.   The practice is going to call pt and get her scheduled with Dr. ONEIDA.

## 2023-08-03 ENCOUNTER — Ambulatory Visit (INDEPENDENT_AMBULATORY_CARE_PROVIDER_SITE_OTHER): Admitting: Sports Medicine

## 2023-08-03 ENCOUNTER — Encounter: Payer: Self-pay | Admitting: Sports Medicine

## 2023-08-03 DIAGNOSIS — M5416 Radiculopathy, lumbar region: Secondary | ICD-10-CM | POA: Diagnosis not present

## 2023-08-03 MED ORDER — MAGNESIUM OXIDE 400 MG PO TABS: 800.0000 mg | ORAL_TABLET | Freq: Every day | ORAL | 3 refills | Status: DC

## 2023-08-03 MED ORDER — GABAPENTIN 300 MG PO CAPS: ORAL_CAPSULE | ORAL | 3 refills | Status: DC

## 2023-08-03 NOTE — Progress Notes (Signed)
    Procedures performed today:    None.  Independent interpretation of notes and tests performed by another provider:   None.  Brief History, Exam, Impression, and Recommendations:    Right lumbar radiculitis Very pleasant 78 year old female, she has had about a month of discomfort right anterior/lateral lower leg, often worse at night and when waking up in the morning. No overt back pain, no overt numbness or tingling in the toes or feet, nothing in the thigh. No bowel or bladder dysfunction, saddle numbness or constitutional symptoms. She was seen at [facility], and x-rays showed multilevel lumbar DDD, she was given prednisone  which unfortunately did not help, has not done any physical therapy. She went to urgent care and x-rays that did show some knee osteoarthritis. Overall she is doing okay but still has some discomfort. Her exam is normal, no pain in the knee, full motion, full strength, negative Homans' sign, good motion and strength in the lower extremities, no reproducible back pain, negative straight leg raise. I with [facility] and suspect this is a radicular problem. I explained the anatomy and pathophysiology of lumbar disc disease, she understands the importance of aggressive physical therapy to decompress the discs and improve her core musculature. Since symptoms are predominately nocturnal we will add Neurontin  and magnesium  oxide for nighttime use. Return to see me in approximately 6 weeks, we will consider MR for interventional planning if not better.    ____________________________________________ Debby PARAS. Curtis, M.D., ABFM., CAQSM., AME. Primary Care and Sports Medicine [facility]  Adjunct Professor of [facility] Medicine  [employer]  Restaurant manager, fast food

## 2023-08-03 NOTE — Assessment & Plan Note (Signed)
 Very pleasant 78 year old female, she has had about a month of discomfort right anterior/lateral lower leg, often worse at night and when waking up in the morning. No overt back pain, no overt numbness or tingling in the toes or feet, nothing in the thigh. No bowel or bladder dysfunction, saddle numbness or constitutional symptoms. She was seen at [facility], and x-rays showed multilevel lumbar DDD, she was given prednisone  which unfortunately did not help, has not done any physical therapy. She went to urgent care and x-rays that did show some knee osteoarthritis. Overall she is doing okay but still has some discomfort. Her exam is normal, no pain in the knee, full motion, full strength, negative Homans' sign, good motion and strength in the lower extremities, no reproducible back pain, negative straight leg raise. I with [facility] and suspect this is a radicular problem. I explained the anatomy and pathophysiology of lumbar disc disease, she understands the importance of aggressive physical therapy to decompress the discs and improve her core musculature. Since symptoms are predominately nocturnal we will add Neurontin  and magnesium  oxide for nighttime use. Return to see me in approximately 6 weeks, we will consider MR for interventional planning if not better.

## 2023-08-06 ENCOUNTER — Ambulatory Visit: Attending: Sports Medicine | Admitting: Physical Therapy

## 2023-08-06 ENCOUNTER — Other Ambulatory Visit: Payer: Self-pay

## 2023-08-06 ENCOUNTER — Encounter: Payer: Self-pay | Admitting: Physical Therapy

## 2023-08-06 DIAGNOSIS — M5416 Radiculopathy, lumbar region: Secondary | ICD-10-CM | POA: Diagnosis not present

## 2023-08-06 DIAGNOSIS — R29898 Other symptoms and signs involving the musculoskeletal system: Secondary | ICD-10-CM | POA: Insufficient documentation

## 2023-08-06 DIAGNOSIS — M6281 Muscle weakness (generalized): Secondary | ICD-10-CM | POA: Insufficient documentation

## 2023-08-06 NOTE — Therapy (Signed)
 OUTPATIENT PHYSICAL THERAPY THORACOLUMBAR EVALUATION   Patient Name: Claudia Joseph MRN: [id number] DOB:09-23-1945, 78 y.o., female Today's Date: 08/06/2023  END OF SESSION:  PT End of Session - 08/06/23 1116     Visit Number 1    Number of Visits 16    Date for PT Re-Evaluation 10/01/23    Authorization Type [employer]    PT Start Time 0930    PT Stop Time 1013    PT Time Calculation (min) 43 min    Activity Tolerance Patient tolerated treatment well    Behavior During Therapy WFL for tasks assessed/performed          Past Medical History:  Diagnosis Date   COVID    GERD (gastroesophageal reflux disease)    Head pain, chronic    history of   Hernia    Hypertension    Past Surgical History:  Procedure Laterality Date   APPENDECTOMY     CHOLECYSTECTOMY     COLONOSCOPY     gallstone     TONSILLECTOMY     TUBAL LIGATION     Patient Active Problem List   Diagnosis Date Noted   Right lumbar radiculitis 08/03/2023   Caregiver stress 12/23/2022   Snoring 11/17/2022   Hepatic steatosis 04/15/2022   Aortic atherosclerosis (HCC) 04/15/2022   Acute asthmatic bronchitis 03/25/2022   COVID-19 long hauler 09/26/2020   Bronchiectasis without complication (HCC) 02/02/2018   Primary osteoarthritis of both knees 08/26/2015   Chronic seasonal allergic rhinitis 07/16/2015   Obesity (BMI 30-39.9) 10/19/2013   Multiple benign melanocytic nevi 08/22/2013   Pulmonary nodules 04/27/2011   Hypertension     PCP: Alvan  REFERRING PROVIDER: Thekkekandam  REFERRING DIAG: Rt lumbar radiculitis  Rationale for Evaluation and Treatment: Rehabilitation  THERAPY DIAG:  Other symptoms and signs involving the musculoskeletal system  Muscle weakness (generalized)  ONSET DATE: 05/2023  SUBJECTIVE:                                                                                                                                                                                            SUBJECTIVE STATEMENT: Pt states that 2 months ago she began having Rt lower leg pain like something was twisting my leg. She had blood tests and began drinking more water due to dehydration. She saw an MD at [facility] who diagnosed her with a herniated disc and prescribed meds. She said after completing the meds she continued to have cramping in bilat LEs. She then saw Dr. Curtis who agrees with [facility] about disc issue and recommended PT. Pt states that she continues to have muscle  cramps in her Rt thigh, no back pain. Pain happens at night when she wakes up to use the bathroom and after prolonged sitting. Pain eases with heat.  PERTINENT HISTORY:  OA, HTN  PAIN:  Are you having pain? Yes: NPRS scale: 1/10 currently, 8/10 at worst Pain location: Rt thigh Pain description: cramping Aggravating factors: prolonged sitting, waking from sleep Relieving factors: heat  PRECAUTIONS: None  RED FLAGS: None   WEIGHT BEARING RESTRICTIONS: No  FALLS:  Has patient fallen in last 6 months? No    OCCUPATION: works at [employer] office 1 day per week, yard work  PLOF: Independent  PATIENT GOALS: reduce cramping  NEXT MD VISIT: PRN  OBJECTIVE:  Note: Objective measures were completed at Evaluation unless otherwise noted.  DIAGNOSTIC FINDINGS:  None on file  PATIENT SURVEYS:  PSFS: THE PATIENT SPECIFIC FUNCTIONAL SCALE  Place score of 0-10 (0 = unable to perform activity and 10 = able to perform activity at the same level as before injury or problem)  Activity Date: 08/06/23    Stand up from prolonged sitting 2    2.Get up in the middle of the night 2    3.     4.      Average Score 2      Total Score = Sum of activity scores/number of activities  Minimally Detectable Change: 3 points (for single activity); 2 points (for average score)  [facility] (nd). The Patient Specific Functional Scale . Retrieved from  [email or web address]   COGNITION: Overall cognitive status: Within functional limits for tasks assessed     SENSATION: WFL  MUSCLE LENGTH: Hamstrings: Right 100 deg; Left 95 deg Thomas test: positive bilat Decreased quad length bilat  POSTURE: increased thoracic kyphosis  PALPATION: TTP glutes bilat, ischial tub bilat, piriformis Rt  LUMBAR ROM:   AROM eval  Flexion WFL  Extension WFL  Right lateral flexion 50%  Left lateral flexion 50%  Right rotation 50%  Left rotation 50%   (Blank rows = not tested)    LOWER EXTREMITY MMT:    MMT Right eval Left eval  Hip flexion 3+ 3+  Hip extension    Hip abduction 4 4  Hip adduction    Hip internal rotation    Hip external rotation 4 4  Knee flexion    Knee extension    Ankle dorsiflexion    Ankle plantarflexion    Ankle inversion    Ankle eversion     (Blank rows = not tested)  LUMBAR SPECIAL TESTS:  Straight leg raise test: Negative    TREATMENT DATE: 08/06/23 See HEP Pt educated on PT POC and goals, HEP, rationale for treatment                                                                                                                                 PATIENT EDUCATION:  Education details: PT POC and goals, HEP Person  educated: Patient Education method: Explanation, Demonstration, and Handouts Education comprehension: verbalized understanding and returned demonstration  HOME EXERCISE PROGRAM: Access Code: [email or web address] URL: [email or web address] Date: 08/06/2023 Prepared by: Darice Conine  Exercises - Supine Piriformis Stretch with Leg Straight  - 1 x daily - 7 x weekly - 1 sets - 3 reps - 20-30 seconds hold - Sidelying Quadriceps Stretch with Strap  - 1 x daily - 7 x weekly - 1 sets - 3 reps - 20-30 seconds hold - Seated Sidebending  - 1 x daily - 7 x weekly - 2 sets - 10 reps  ASSESSMENT:  CLINICAL IMPRESSION: Patient is a 78 y.o.  female who was seen today for physical therapy evaluation and treatment for Rt lumbar radiculitis. She presents with increased mm spasticity, decreased flexibility and ROM, decreased core strength and endurance, decreased functional activity tolerance. Pt will benefit from skilled PT to address deficits and improve functional mobility with decreased pain.   OBJECTIVE IMPAIRMENTS: decreased activity tolerance, decreased ROM, decreased strength, increased muscle spasms, impaired flexibility, and pain.   ACTIVITY LIMITATIONS: transfers and locomotion level  PARTICIPATION LIMITATIONS: community activity  PERSONAL FACTORS: Age and Time since onset of injury/illness/exacerbation are also affecting patient's functional outcome.   REHAB POTENTIAL: Good  CLINICAL DECISION MAKING: Evolving/moderate complexity  EVALUATION COMPLEXITY: Moderate   GOALS: Goals reviewed with patient? Yes  SHORT TERM GOALS: Target date: 09/03/2023    Pt will be independent with initial HEP Baseline: Goal status: INITIAL  2.  Pt will demo negative Thomas test bilat Baseline:  Goal status: INITIAL    LONG TERM GOALS: Target date: 10/01/2023    Pt will be independent with advanced HEP Baseline:  Goal status: INITIAL  2.  Pt will improve PSFS to >= 4 to demo improved functional mobility Baseline:  Goal status: INITIAL  3.  Pt will report pain <= 1/10 when waking up in the middle of the night to use the restroom Baseline:  Goal status: INITIAL   PLAN:  PT FREQUENCY: 1-2x/week  PT DURATION: 8 weeks  PLANNED INTERVENTIONS: 97164- PT Re-evaluation, 97110-Therapeutic exercises, 97530- Therapeutic activity, 97112- Neuromuscular re-education, 97535- Self Care, 02859- Manual therapy, [id number]- Aquatic Therapy, H9716- Electrical stimulation (unattended), 20560 (1-2 muscles), 20561 (3+ muscles)- Dry Needling, Patient/Family education, Balance training, Taping, Cryotherapy, and Moist heat.  PLAN FOR NEXT  SESSION: assess response to HEP, hip flexor and quad stretch, trunk mobility, core strength   Jewelz Kobus, PT 08/06/2023, 11:17 AM

## 2023-08-10 ENCOUNTER — Ambulatory Visit (INDEPENDENT_AMBULATORY_CARE_PROVIDER_SITE_OTHER): Admitting: Family Medicine

## 2023-08-10 VITALS — BP 150/60 | HR 66 | Ht 67.0 in | Wt 180.0 lb

## 2023-08-10 DIAGNOSIS — B029 Zoster without complications: Secondary | ICD-10-CM

## 2023-08-10 MED ORDER — VALACYCLOVIR HCL 1 G PO TABS: 1000.0000 mg | ORAL_TABLET | Freq: Three times a day (TID) | ORAL | 0 refills | Status: DC

## 2023-08-10 NOTE — Progress Notes (Signed)
 Acute Office Visit  Subjective:     Patient ID: Claudia Joseph, female    DOB: [birth year], 78 y.o.   MRN: [id number]  Chief Complaint  Patient presents with   Abdominal Pain    Pt states that she has had RUQ pain that feels like pins sticking her x3 days  She last had something to eat around 5 pm yesterday. She had a sip of water today    HPI Patient is in today for Pt states that she has had RUQ pain that feels like pins sticking her x3 days She says last night it was so painful but it was an 8 out of 10 she almost went to urgent care.  She says it is very sensitive to touch.  Even just her nightgown touching it last night was painful she noticed a little red blister on the right mid abdomen that today looks more like a bruise.  She says it feels like a prickly sensation like pins sticking her.  No nausea or vomiting.  She does occasionally have some constipation and did take some MiraLAX yesterday she is also been dealing with some right sided low back pain intermittently which she previously saw our sports med doc for.  She last had something to eat around 5 pm yesterday. She had a sip of water today.  She put Biofreeze on it.    ROS      Objective:    BP (!) 150/60   Pulse 66   Ht 5' 7 (1.702 m)   Wt 180 lb (81.6 kg)   SpO2 99%   BMI 28.19 kg/m    Physical Exam Vitals and nursing note reviewed.  Constitutional:      Appearance: Normal appearance.  HENT:     Head: Normocephalic and atraumatic.     Right Ear: Tympanic membrane, ear canal and external ear normal.     Left Ear: Tympanic membrane, ear canal and external ear normal.     Nose: Nose normal.  Eyes:     Extraocular Movements: Extraocular movements intact.     Conjunctiva/sclera: Conjunctivae normal.  Neck:     Thyroid : No thyromegaly.  Cardiovascular:     Rate and Rhythm: Normal rate and regular rhythm.  Pulmonary:     Effort: Pulmonary effort is normal.     Breath sounds: Normal breath sounds.   Abdominal:     General: Bowel sounds are normal.     Palpations: Abdomen is soft.     Tenderness: There is no abdominal tenderness. There is no guarding.  Musculoskeletal:        General: No swelling.     Cervical back: Neck supple.  Skin:    General: Skin is warm and dry.  Neurological:     Mental Status: She is alert and oriented to person, place, and time.  Psychiatric:        Mood and Affect: Mood normal.        Behavior: Behavior normal.     No results found for any visits on 08/10/23.      Assessment & Plan:   Problem List Items Addressed This Visit   None Visit Diagnoses       Herpes zoster without complication    -  Primary   Relevant Medications   valACYclovir  (VALTREX ) 1000 MG tablet      History exam is consistent with shingles.  She has what sounds like a blister yesterday today it looks more like a bruised  area she has a couple erythematous places in the distribution of the dermatome radiating from her low back around to her right mid abdomen towards the umbilicus.  I strongly suspect shingles will treat with valacyclovir  okay to use topical lidocaine etc. if pain not well-controlled then please reach out and let us  know.  Meds ordered this encounter  Medications   valACYclovir  (VALTREX ) 1000 MG tablet    Sig: Take 1 tablet (1,000 mg total) by mouth 3 (three) times daily for 10 days.    Dispense:  21 tablet    Refill:  0    No follow-ups on file.  Dorothyann Byars, MD

## 2023-08-11 ENCOUNTER — Ambulatory Visit: Admitting: Physical Therapy

## 2023-08-12 ENCOUNTER — Ambulatory Visit: Payer: Self-pay

## 2023-08-12 ENCOUNTER — Encounter: Payer: Self-pay | Admitting: Family Medicine

## 2023-08-12 DIAGNOSIS — R1011 Right upper quadrant pain: Secondary | ICD-10-CM

## 2023-08-12 MED ORDER — GABAPENTIN 100 MG PO CAPS: ORAL_CAPSULE | ORAL | 1 refills | Status: DC

## 2023-08-12 NOTE — Telephone Encounter (Signed)
 FYI Only or Action Required?: Action required by provider: states was told to call back if pain did not get better.  Would like a call back and is heading to the ER.  Patient was last seen in primary care on 08/10/2023 by Alvan Dorothyann BIRCH, MD.  Called Nurse Triage reporting Abdominal Pain.  Symptoms began a week ago.  Interventions attempted: Nothing.  Symptoms are: gradually worsening.  Triage Disposition: See HCP Within 4 Hours (Or PCP Triage)  Patient/caregiver understands and will follow disposition?: Yes   To ER       Copied from CRM #[id number]. Topic: Clinical - Red Word Triage >> Aug 12, 2023  8:14 AM [name] wrote: [name] that prompted transfer to Nurse Triage:  severe stomach pain Reason for Disposition  [1] MILD-MODERATE pain AND [2] constant AND [3] present > 2 hours  Answer Assessment - Initial Assessment Questions 1. LOCATION: Where does it hurt?      Right side across from naval 2. RADIATION: Does the pain shoot anywhere else? (e.g., chest, back)     Denies rash, pain in the back 3. ONSET: When did the pain begin? (e.g., minutes, hours or days ago)      Last week 4. SUDDEN: Gradual or sudden onset?     suddenly 5. PATTERN Does the pain come and go, or is it constant?     Comes and goes 6. SEVERITY: How bad is the pain?  (e.g., Scale 1-10; mild, moderate, or severe)     severe 7. RECURRENT SYMPTOM: Have you ever had this type of stomach pain before? If Yes, ask: When was the last time? and What happened that time?      Yes last Monday 8. CAUSE: What do you think is causing the stomach pain? (e.g., gallstones, recent abdominal surgery)     States may have shingles 9. RELIEVING/AGGRAVATING FACTORS: What makes it better or worse? (e.g., antacids, bending or twisting motion, bowel movement)     denies 10. OTHER SYMPTOMS: Do you have any other symptoms? (e.g., back pain, diarrhea, fever, urination pain, vomiting)       Back  pain 11. PREGNANCY: Is there any chance you are pregnant? When was your last menstrual period?       na  Protocols used: Abdominal Pain - Female-A-AH

## 2023-08-13 ENCOUNTER — Ambulatory Visit

## 2023-08-13 ENCOUNTER — Ambulatory Visit: Admitting: Medical-Surgical

## 2023-08-13 ENCOUNTER — Encounter: Payer: Self-pay | Admitting: Medical-Surgical

## 2023-08-13 VITALS — BP 131/74 | HR 70 | Resp 20 | Ht 67.0 in | Wt 180.0 lb

## 2023-08-13 DIAGNOSIS — K760 Fatty (change of) liver, not elsewhere classified: Secondary | ICD-10-CM | POA: Diagnosis not present

## 2023-08-13 DIAGNOSIS — R1011 Right upper quadrant pain: Secondary | ICD-10-CM

## 2023-08-13 DIAGNOSIS — Z9049 Acquired absence of other specified parts of digestive tract: Secondary | ICD-10-CM | POA: Diagnosis not present

## 2023-08-13 LAB — POCT URINALYSIS DIP (CLINITEK)
Bilirubin, UA: NEGATIVE
Blood, UA: NEGATIVE
Glucose, UA: NEGATIVE mg/dL
Ketones, POC UA: NEGATIVE mg/dL
Leukocytes, UA: NEGATIVE
Nitrite, UA: NEGATIVE
POC PROTEIN,UA: NEGATIVE
Spec Grav, UA: 1.025 (ref 1.010–1.025)
Urobilinogen, UA: 0.2 U/dL
pH, UA: 5.5 (ref 5.0–8.0)

## 2023-08-13 NOTE — Progress Notes (Signed)
 Established patient visit  Discussed the use of AI scribe software for clinical note transcription with the patient, who gave verbal consent to proceed.  History of Present Illness   Claudia Joseph is an 78 year old female who presents with severe abdominal pain and suspected shingles.  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  Constitutional:      General: She is not in acute distress.    Appearance: Normal appearance. She is well-developed. She is obese. She is not ill-appearing.  HENT:     Head: Normocephalic and atraumatic.  Cardiovascular:     Rate and Rhythm: Normal rate and regular rhythm.     Pulses: Normal pulses.     Heart sounds: Normal heart sounds. No murmur heard.    No friction rub. No gallop.  Pulmonary:     Effort: Pulmonary effort is normal. No respiratory distress.     Breath sounds: Normal breath sounds. No wheezing.  Abdominal:     General: Bowel sounds are normal. There is no distension or abdominal bruit. There are no signs of injury.     Palpations: Abdomen is soft. There is no shifting dullness, hepatomegaly, splenomegaly or mass.     Tenderness: There is abdominal tenderness in the  right upper quadrant. There is no right CVA tenderness, left CVA tenderness, guarding or rebound. Negative signs include Rovsing's sign.     Hernia: No hernia is present.  Skin:    General: Skin is warm and dry.         Comments: Slightly enlarged pores consistent with inflammation.  Neurological:     Mental Status: She is alert and oriented to person, place, and time.  Psychiatric:        Mood and Affect: Mood normal.        Behavior: Behavior normal.        Thought Content: Thought content normal.        Judgment: Judgment normal.    Assessment and Plan    Abdominal Pain Experiences nocturnal stabbing, burning abdominal pain radiating to the back. Differential includes internal shingles, nephrolithiasis, or other abdominal pathology. On Valtrex  for suspected shingles without improvement. Refuses gabapentin  due to side effects. Impaired hepatic and renal function may be relevant. - Order complete abdominal ultrasound. - Perform urinalysis- negative - Discuss alternative pain management options, such as Lyrica but declined. - Continue Valtrex . - Continue Tylenol as needed.  Goals of Care Prefers to avoid gabapentin  due to past negative experiences. Open to alternative treatments. - Respect decision to avoid gabapentin  and explore alternative pain management options.  Follow-up Scheduled for abdominal ultrasound. Prefers follow-up communication via phone call due to MyChart access issues. - Call with results of the abdominal ultrasound and urinalysis.  Return if symptoms worsen or fail to improve.  __________________________________ Zada FREDRIK Palin, DNP, APRN, FNP-BC Primary Care and Sports Medicine [facility] [place]

## 2023-08-16 ENCOUNTER — Ambulatory Visit: Payer: Self-pay | Admitting: Family Medicine

## 2023-08-16 DIAGNOSIS — B029 Zoster without complications: Secondary | ICD-10-CM

## 2023-08-16 DIAGNOSIS — R1011 Right upper quadrant pain: Secondary | ICD-10-CM

## 2023-08-16 NOTE — Progress Notes (Signed)
 Hi Libby, metabolic panel overall looks good no sign of systemic infection I did check liver function as well as kidney just to make sure that nothing was unusual going on there and that looks great.  No sign of pancreatitis.  Other labs still pending.

## 2023-08-17 ENCOUNTER — Other Ambulatory Visit: Payer: Self-pay | Admitting: Family Medicine

## 2023-08-17 ENCOUNTER — Ambulatory Visit: Admitting: Physical Therapy

## 2023-08-17 DIAGNOSIS — B029 Zoster without complications: Secondary | ICD-10-CM

## 2023-08-18 ENCOUNTER — Ambulatory Visit: Payer: Self-pay | Admitting: Medical-Surgical

## 2023-08-18 DIAGNOSIS — R1011 Right upper quadrant pain: Secondary | ICD-10-CM

## 2023-08-18 LAB — CMP14+EGFR
ALT: 16 IU/L (ref 0–32)
AST: 21 IU/L (ref 0–40)
Albumin: 4.3 g/dL (ref 3.8–4.8)
Alkaline Phosphatase: 119 IU/L (ref 44–121)
BUN/Creatinine Ratio: 20 (ref 12–28)
BUN: 16 mg/dL (ref 8–27)
Bilirubin Total: 0.3 mg/dL (ref 0.0–1.2)
CO2: 18 mmol/L — ABNORMAL LOW (ref 20–29)
Calcium: 10.1 mg/dL (ref 8.7–10.3)
Chloride: 106 mmol/L (ref 96–106)
Creatinine, Ser: 0.8 mg/dL (ref 0.57–1.00)
Globulin, Total: 2.6 g/dL (ref 1.5–4.5)
Glucose: 104 mg/dL — ABNORMAL HIGH (ref 70–99)
Potassium: 3.9 mmol/L (ref 3.5–5.2)
Sodium: 141 mmol/L (ref 134–144)
Total Protein: 6.9 g/dL (ref 6.0–8.5)
eGFR: 74 mL/min/1.73 (ref >59)

## 2023-08-18 LAB — CBC WITH DIFFERENTIAL/PLATELET
Basophils Absolute: 0.1 x10E3/uL (ref 0.0–0.2)
Basos: 1 %
EOS (ABSOLUTE): 0.3 x10E3/uL (ref 0.0–0.4)
Eos: 5 %
Hematocrit: 44.6 % (ref 34.0–46.6)
Hemoglobin: 14.7 g/dL (ref 11.1–15.9)
Immature Grans (Abs): 0 x10E3/uL (ref 0.0–0.1)
Immature Granulocytes: 0 %
Lymphocytes Absolute: 3.4 x10E3/uL — ABNORMAL HIGH (ref 0.7–3.1)
Lymphs: 49 %
MCH: 31.3 pg (ref 26.6–33.0)
MCHC: 33 g/dL (ref 31.5–35.7)
MCV: 95 fL (ref 79–97)
Monocytes Absolute: 0.5 x10E3/uL (ref 0.1–0.9)
Monocytes: 7 %
Neutrophils Absolute: 2.7 x10E3/uL (ref 1.4–7.0)
Neutrophils: 38 %
Platelets: 326 x10E3/uL (ref 150–450)
RBC: 4.7 x10E6/uL (ref 3.77–5.28)
RDW: 12.6 % (ref 11.7–15.4)
WBC: 7.1 x10E3/uL (ref 3.4–10.8)

## 2023-08-18 LAB — VARICELLA ZOSTER ANTIBODY, IGG: Varicella zoster IgG: REACTIVE

## 2023-08-18 LAB — LIPASE: Lipase: 17 U/L (ref 14–85)

## 2023-08-18 LAB — VARICELLA ZOSTER ANTIBODY, IGM: Varicella IgM: <0.91 {index} (ref 0.00–0.90)

## 2023-08-18 MED ORDER — VALACYCLOVIR HCL 1 G PO TABS: 1000.0000 mg | ORAL_TABLET | Freq: Three times a day (TID) | ORAL | 0 refills | Status: AC

## 2023-08-18 NOTE — Progress Notes (Signed)
 OK, I think ok to order CT.  Do you feel comfortable with that?

## 2023-08-18 NOTE — Progress Notes (Signed)
 Report just showed up in my box.  Looks like there were no findings on ultrasound to explain her symptoms.  She does have fatty liver disease but no other abnormalities.  Would you like me to order advanced imaging such as CT or MRI for further evaluation?

## 2023-08-18 NOTE — Telephone Encounter (Signed)
 Change imaging to STAT.

## 2023-08-18 NOTE — Telephone Encounter (Signed)
 Call imaging and get them to read the US 

## 2023-08-18 NOTE — Progress Notes (Signed)
 Hi Claudia Joseph, you do have antibodies to varicella.  How are you feeling?  Is the medication helping?  Did you end up breaking out with more of a rash than you had when I saw you?

## 2023-08-19 ENCOUNTER — Other Ambulatory Visit

## 2023-08-19 DIAGNOSIS — R1011 Right upper quadrant pain: Secondary | ICD-10-CM

## 2023-08-19 DIAGNOSIS — R160 Hepatomegaly, not elsewhere classified: Secondary | ICD-10-CM | POA: Diagnosis not present

## 2023-08-19 DIAGNOSIS — K8689 Other specified diseases of pancreas: Secondary | ICD-10-CM | POA: Diagnosis not present

## 2023-08-19 DIAGNOSIS — K5730 Diverticulosis of large intestine without perforation or abscess without bleeding: Secondary | ICD-10-CM | POA: Diagnosis not present

## 2023-08-19 NOTE — Telephone Encounter (Signed)
 CT ordered in separated message - conversation between patient and provider's nurse regarding this also in Mychart.

## 2023-08-20 ENCOUNTER — Ambulatory Visit: Admitting: Physical Therapy

## 2023-08-23 ENCOUNTER — Telehealth: Payer: Self-pay | Admitting: Family Medicine

## 2023-08-23 ENCOUNTER — Encounter: Payer: Self-pay | Admitting: Medical-Surgical

## 2023-08-23 ENCOUNTER — Telehealth: Payer: Self-pay | Admitting: Medical-Surgical

## 2023-08-23 ENCOUNTER — Ambulatory Visit: Payer: Self-pay | Admitting: Medical-Surgical

## 2023-08-23 NOTE — Telephone Encounter (Signed)
 Pt is requesting note for a wedding on Saturday stating that she is not contagious.

## 2023-08-23 NOTE — Telephone Encounter (Signed)
 Pt is requesting results for CT Scan. Can we expedite this?

## 2023-08-24 ENCOUNTER — Encounter: Admitting: Physical Therapy

## 2023-08-25 NOTE — Telephone Encounter (Signed)
 She still having symptoms I would like to refer her to GI.  As I am still not quite sure what is causing her pain and discomfort.  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  Going to go ahead and place a GI referra  Orders Placed This Encounter  Procedures   Ambulatory referral to Gastroenterology    Referral Priority:   Routine    Referral Type:   Consultation    Referral Reason:   Specialty Services Required    Number of Visits Requested:   1   l.

## 2023-08-25 NOTE — Telephone Encounter (Signed)
 Okay for note stating that she is not contagious.

## 2023-08-25 NOTE — Telephone Encounter (Signed)
 Pt called and advised that she can print letter from my chart.

## 2023-08-26 DIAGNOSIS — H21233 Degeneration of iris (pigmentary), bilateral: Secondary | ICD-10-CM | POA: Diagnosis not present

## 2023-08-26 DIAGNOSIS — D3132 Benign neoplasm of left choroid: Secondary | ICD-10-CM | POA: Diagnosis not present

## 2023-08-26 DIAGNOSIS — H35033 Hypertensive retinopathy, bilateral: Secondary | ICD-10-CM | POA: Diagnosis not present

## 2023-08-27 ENCOUNTER — Encounter: Admitting: Physical Therapy

## 2023-08-27 NOTE — Telephone Encounter (Signed)
 Patient informed.

## 2023-08-27 NOTE — Telephone Encounter (Signed)
 He would not be contagious at this point.  No open lesions, etc

## 2023-08-31 ENCOUNTER — Ambulatory Visit: Admitting: Physical Therapy

## 2023-09-03 ENCOUNTER — Ambulatory Visit: Admitting: Physical Therapy

## 2023-09-07 ENCOUNTER — Ambulatory Visit: Admitting: Physical Therapy

## 2023-09-10 ENCOUNTER — Ambulatory Visit: Admitting: Physical Therapy

## 2023-09-21 ENCOUNTER — Encounter: Payer: Self-pay | Admitting: Sports Medicine

## 2023-10-19 ENCOUNTER — Ambulatory Visit: Admitting: Adult Health

## 2023-10-19 ENCOUNTER — Encounter

## 2023-11-03 ENCOUNTER — Encounter

## 2023-11-18 DIAGNOSIS — L814 Other melanin hyperpigmentation: Secondary | ICD-10-CM | POA: Diagnosis not present

## 2023-11-18 DIAGNOSIS — Z85828 Personal history of other malignant neoplasm of skin: Secondary | ICD-10-CM | POA: Diagnosis not present

## 2023-11-18 DIAGNOSIS — L578 Other skin changes due to chronic exposure to nonionizing radiation: Secondary | ICD-10-CM | POA: Diagnosis not present

## 2023-11-18 DIAGNOSIS — L570 Actinic keratosis: Secondary | ICD-10-CM | POA: Diagnosis not present

## 2023-11-18 DIAGNOSIS — L821 Other seborrheic keratosis: Secondary | ICD-10-CM | POA: Diagnosis not present

## 2023-11-18 DIAGNOSIS — Z08 Encounter for follow-up examination after completed treatment for malignant neoplasm: Secondary | ICD-10-CM | POA: Diagnosis not present

## 2023-11-29 ENCOUNTER — Ambulatory Visit: Admitting: Adult Health

## 2023-11-30 ENCOUNTER — Ambulatory Visit: Admitting: Family Medicine

## 2023-12-01 ENCOUNTER — Ambulatory Visit: Admitting: Family Medicine

## 2023-12-01 DIAGNOSIS — R7309 Other abnormal glucose: Secondary | ICD-10-CM

## 2023-12-01 DIAGNOSIS — M1711 Unilateral primary osteoarthritis, right knee: Secondary | ICD-10-CM | POA: Diagnosis not present

## 2023-12-18 ENCOUNTER — Ambulatory Visit

## 2023-12-19 ENCOUNTER — Ambulatory Visit
Admission: RE | Admit: 2023-12-19 | Discharge: 2023-12-19 | Disposition: A | Discharge status: Home / Self Care | Source: Ambulatory Visit | Attending: Internal Medicine | Admitting: Internal Medicine

## 2023-12-19 ENCOUNTER — Ambulatory Visit

## 2023-12-19 ENCOUNTER — Other Ambulatory Visit: Payer: Self-pay

## 2023-12-19 VITALS — BP 143/80 | HR 74 | Temp 97.4°F | Resp 16

## 2023-12-19 DIAGNOSIS — M25512 Pain in left shoulder: Secondary | ICD-10-CM | POA: Diagnosis not present

## 2023-12-19 DIAGNOSIS — M79622 Pain in left upper arm: Secondary | ICD-10-CM | POA: Diagnosis not present

## 2023-12-19 DIAGNOSIS — M79602 Pain in left arm: Secondary | ICD-10-CM

## 2023-12-19 DIAGNOSIS — N644 Mastodynia: Secondary | ICD-10-CM | POA: Diagnosis not present

## 2023-12-19 DIAGNOSIS — W19XXXA Unspecified fall, initial encounter: Secondary | ICD-10-CM | POA: Diagnosis not present

## 2023-12-19 DIAGNOSIS — M19012 Primary osteoarthritis, left shoulder: Secondary | ICD-10-CM | POA: Diagnosis not present

## 2023-12-19 DIAGNOSIS — R0789 Other chest pain: Secondary | ICD-10-CM | POA: Diagnosis not present

## 2023-12-19 DIAGNOSIS — R0781 Pleurodynia: Secondary | ICD-10-CM | POA: Diagnosis not present

## 2023-12-19 NOTE — ED Triage Notes (Signed)
 Fall on 11/25, has c/o left shoulder and left rib pain. Took advil.

## 2023-12-19 NOTE — Discharge Instructions (Signed)
 X-rays are normal.  Suspect bruising causing your pain.  You may apply ice to affected areas as needed.  Follow-up with orthopedist if pain persists or worsens.

## 2023-12-19 NOTE — ED Provider Notes (Addendum)
 [facility]    CSN: [id number] Arrival date & time: 12/19/23  1133      History   Chief Complaint Chief Complaint  Patient presents with   Fall    HPI Claudia Joseph is a 78 y.o. female.   Patient presents with left shoulder pain and left sided rib pain that started on 11/25 after a fall.  Patient reports that she missed a stepped coming out of a house causing her to fall sideways.  Left arm and ribs impacted the side of the porch.  Denies head injury or loss of consciousness.  She does not take any blood thinning medications.  She has taken Advil for pain. She is not reporting any shortness of breath.    Fall    Past Medical History:  Diagnosis Date   COVID    GERD (gastroesophageal reflux disease)    Head pain, chronic    history of   Hernia    Hypertension     Patient Active Problem List   Diagnosis Date Noted   Right lumbar radiculitis 08/03/2023   Caregiver stress 12/23/2022   Snoring 11/17/2022   Hepatic steatosis 04/15/2022   Aortic atherosclerosis 04/15/2022   Acute asthmatic bronchitis 03/25/2022   COVID-19 long hauler 09/26/2020   Bronchiectasis without complication (HCC) 02/02/2018   Primary osteoarthritis of both knees 08/26/2015   Chronic seasonal allergic rhinitis 07/16/2015   Obesity (BMI 30-39.9) 10/19/2013   Multiple benign melanocytic nevi 08/22/2013   Pulmonary nodules 04/27/2011   Hypertension     Past Surgical History:  Procedure Laterality Date   APPENDECTOMY     CHOLECYSTECTOMY     COLONOSCOPY     gallstone     TONSILLECTOMY     TUBAL LIGATION      OB History   No obstetric history on file.      Home Medications    Prior to Admission medications   Medication Sig Start Date End Date Taking? Authorizing Provider  albuterol  (PROVENTIL ) (2.5 MG/3ML) 0.083% nebulizer solution Take 3 mLs (2.5 mg total) by nebulization every 6 (six) hours as needed for wheezing or shortness of breath. 02/13/22   Parrett, Madelin RAMAN, NP   albuterol  (VENTOLIN  HFA) 108 (90 Base) MCG/ACT inhaler Inhale 2 puffs into the lungs every 6 (six) hours as needed for wheezing or shortness of breath. 02/12/22   Parrett, Madelin RAMAN, NP  amLODipine  (NORVASC ) 10 MG tablet Take 1 tablet (10 mg total) by mouth daily. 05/28/23   Alvan Dorothyann BIRCH, MD  aspirin EC 81 MG tablet Take 81 mg by mouth daily. Swallow whole.    [provider]  cholecalciferol (VITAMIN D ) 1000 units tablet Take 1,000 Units by mouth daily.    [provider]  gabapentin  (NEURONTIN ) 100 MG capsule Take 1 by mouth TID. Ok to increase to 2 Tabs TID after 2 days and then 3 tabs TID after 2 days if tolerated 08/12/23   Alvan Dorothyann BIRCH, MD  magnesium  oxide (MAG-OX) 400 (240 Mg) MG tablet Take 2 tablets by mouth at bedtime. 08/03/23   [provider]  metoprolol  succinate (TOPROL -XL) 25 MG 24 hr tablet Take 1 tablet (25 mg total) by mouth daily. 05/28/23   Alvan Dorothyann BIRCH, MD    Family History Family History  Problem Relation Age of Onset   Hypertension Mother    Leukemia Father        deceased   Tongue cancer Brother    Pancreatic cancer Maternal Grandmother  Lung disease Neg Hx     Social History Social History   Tobacco Use   Smoking status: Never   Smokeless tobacco: Never  Vaping Use   Vaping status: Never Used  Substance Use Topics   Alcohol use: No   Drug use: No     Allergies   Hydromorphone hcl, Meperidine hcl, and Morphine and codeine   Review of Systems Review of Systems Per HPI  Physical Exam Triage Vital Signs ED Triage Vitals  Encounter Vitals Group     BP 12/19/23 1146 (!) 143/80     Girls Systolic BP Percentile --      Girls Diastolic BP Percentile --      Boys Systolic BP Percentile --      Boys Diastolic BP Percentile --      Pulse Rate 12/19/23 1146 74     Resp 12/19/23 1146 16     Temp 12/19/23 1146 (!) 97.4 F (36.3 C)     Temp src --      SpO2 12/19/23 1146 96 %     Weight --      Height  --      Head Circumference --      Peak Flow --      Pain Score 12/19/23 1148 7     Pain Loc --      Pain Education --      Exclude from Growth Chart --    No data found.  Updated Vital Signs BP (!) 143/80   Pulse 74   Temp (!) 97.4 F (36.3 C)   Resp 16   SpO2 96%   Visual Acuity Right Eye Distance:   Left Eye Distance:   Bilateral Distance:    Right Eye Near:   Left Eye Near:    Bilateral Near:     Physical Exam Constitutional:      General: She is not in acute distress.    Appearance: Normal appearance. She is not toxic-appearing or diaphoretic.  HENT:     Head: Normocephalic and atraumatic.  Eyes:     Extraocular Movements: Extraocular movements intact.     Conjunctiva/sclera: Conjunctivae normal.  Cardiovascular:     Rate and Rhythm: Normal rate and regular rhythm.     Pulses: Normal pulses.     Heart sounds: Normal heart sounds.  Pulmonary:     Effort: Pulmonary effort is normal. No respiratory distress.     Breath sounds: Normal breath sounds. No stridor. No wheezing, rhonchi or rales.  Chest:       Comments: Tenderness to palpation to left lower anterior ribs.  There is no discoloration, no bruising, no laceration, no abrasions noted. Musculoskeletal:       Back:     Comments: Yellow discolored bruise present to left thoracic lateral back overlying left lower ribs.  No laceration or abrasion noted.  There is mild tenderness to palpation to this area. No direct spinal tenderness, crepitus, strepoff noted.   Mild tenderness to palpation to lateral left shoulder.  Patient reports pain with abduction at about 45 degrees.  There is no laceration, no abrasion, no discoloration noted.  Grip strength is 5/5.  Neurovascularly intact.  Neurological:     General: No focal deficit present.     Mental Status: She is alert and oriented to person, place, and time. Mental status is at baseline.  Psychiatric:        Mood and Affect: Mood normal.  Behavior:  Behavior normal.        Thought Content: Thought content normal.        Judgment: Judgment normal.      UC Treatments / Results  Labs (all labs ordered are listed, but only abnormal results are displayed) Labs Reviewed - No data to display  EKG   Radiology DG Ribs Unilateral W/Chest Left Result Date: 12/19/2023 CLINICAL DATA:  Fall with pain under left breast. EXAM: LEFT RIBS AND CHEST - 3+ VIEW COMPARISON:  02/20/2017 FINDINGS: Lungs are adequately inflated and otherwise clear. Mediastinal silhouette is normal. No acute fracture. Remainder of the exam is unchanged. IMPRESSION: No acute findings. Electronically Signed   By: Toribio Agreste M.D.   On: 12/19/2023 12:45   DG Humerus Left Result Date: 12/19/2023 CLINICAL DATA:  Fall with left upper arm pain. EXAM: LEFT HUMERUS - 2+ VIEW COMPARISON:  None Available. FINDINGS: Alignment and bone mineralization is normal. No acute fracture or dislocation. Remainder of the exam is unremarkable. IMPRESSION: No acute findings. Electronically Signed   By: Toribio Agreste M.D.   On: 12/19/2023 12:43   DG Shoulder Left Result Date: 12/19/2023 CLINICAL DATA:  Fall with pain under left breast and left shoulder/scapula. EXAM: LEFT SHOULDER - 2+ VIEW COMPARISON:  None Available. FINDINGS: Minimal osteoarthritic change of the AC joint. Glenohumeral joint is within normal. No acute fracture or dislocation. IMPRESSION: 1. No acute findings. 2. Minimal osteoarthritic change of the AC joint. Electronically Signed   By: Toribio Agreste M.D.   On: 12/19/2023 12:42    Procedures Procedures (including critical care time)  Medications Ordered in UC Medications - No data to display  Initial Impression / Assessment and Plan / UC Course  I have reviewed the triage vital signs and the nursing notes.  Pertinent labs & imaging results that were available during my care of the patient were reviewed by me and considered in my medical decision making (see chart for  details).     X-rays completed of left shoulder, left humerus, left lateral ribs that were negative for any acute bony abnormality.  Suspect contusion of areas causing pain given mechanism of injury.  Educated patient on deep breathing techniques given left rib pain.  Advised patient of supportive care including ice application and safe over-the-counter pain relievers as needed.  Advised patient to follow-up with orthopedist and/or PCP if pain persists or worsens.  Patient verbalized understanding and was agreeable with plan. Final Clinical Impressions(s) / UC Diagnoses   Final diagnoses:  Fall, initial encounter  Left arm pain  Rib pain on left side     Discharge Instructions      X-rays are normal.  Suspect bruising causing your pain.  You may apply ice to affected areas as needed.  Follow-up with orthopedist if pain persists or worsens.     ED Prescriptions   None    PDMP not reviewed this encounter.   Hazen Darryle BRAVO, [place] 12/19/23 1314    Hazen Darryle BRAVO, [place] 12/19/23 1315

## 2023-12-29 ENCOUNTER — Ambulatory Visit: Admitting: Family Medicine

## 2023-12-29 VITALS — BP 130/66 | HR 75 | Ht 67.0 in | Wt 180.0 lb

## 2023-12-29 DIAGNOSIS — M7121 Synovial cyst of popliteal space [Baker], right knee: Secondary | ICD-10-CM | POA: Diagnosis not present

## 2023-12-29 DIAGNOSIS — R7989 Other specified abnormal findings of blood chemistry: Secondary | ICD-10-CM | POA: Diagnosis not present

## 2023-12-29 DIAGNOSIS — R351 Nocturia: Secondary | ICD-10-CM | POA: Diagnosis not present

## 2023-12-29 DIAGNOSIS — R7309 Other abnormal glucose: Secondary | ICD-10-CM

## 2023-12-29 DIAGNOSIS — H35033 Hypertensive retinopathy, bilateral: Secondary | ICD-10-CM | POA: Diagnosis not present

## 2023-12-29 DIAGNOSIS — Z6828 Body mass index (BMI) 28.0-28.9, adult: Secondary | ICD-10-CM | POA: Diagnosis not present

## 2023-12-29 DIAGNOSIS — I10 Essential (primary) hypertension: Secondary | ICD-10-CM | POA: Diagnosis not present

## 2023-12-29 LAB — POCT GLYCOSYLATED HEMOGLOBIN (HGB A1C): Hemoglobin A1C: 5.4 % (ref 4.0–5.6)

## 2023-12-29 NOTE — Assessment & Plan Note (Signed)
°  Primary hypertension Home blood pressure normal. Office reading elevated due to rushing and excitement. - Rechecked blood pressure at the end of the visit.

## 2023-12-29 NOTE — Assessment & Plan Note (Signed)
 Baker's cyst with intermittent lower extremity pain and cramping Recent severe pain episode resolved with corticosteroid injection. Cramping managed with magnesium . Advised on activity modification. - Continue magnesium  supplementation for cramping. - Encouraged daily stretching exercises. - Advised on activity modification to prevent sudden shifts in activity levels.

## 2023-12-29 NOTE — Assessment & Plan Note (Signed)
 Recently saw ophthalmology.  BP at goal today.

## 2023-12-29 NOTE — Progress Notes (Addendum)
 Established Patient Office Visit  Patient ID: Claudia Joseph, female    DOB: [birth year]  Age: 78 y.o. MRN: [id number] PCP: Alvan Dorothyann BIRCH, MD  Chief Complaint  Patient presents with   Hypertension   ifg    Subjective:     HPI  Discussed the use of AI scribe software for clinical note transcription with the patient, who gave verbal consent to proceed.  History of Present Illness Claudia Joseph is an 78 year old female who presents for follow-up of hypertension and recent issues with leg cramps and Baker's cyst.  Hypertension - Home blood pressure readings approximately 120/68 mmHg. - Recently acquired a new blood pressure monitor. - Feels in-office readings may be elevated due to feeling rushed.  Leg cramps and activity level - Experiencing leg cramps for approximately four weeks. - Taking magnesium  for leg cramps with noted improvement. - Activity levels vary significantly, believed to contribute to cramping.  Acute leg pain and baker's cyst - Severe episode of leg pain approximately four weeks ago, described as feeling like someone twisted the leg, resulting in inability to walk. - Required assistance from husband during episode. - Diagnosed with Baker's cyst by specialist; cyst was not drained. - Received cortisone injection at specialist visit ant that has really helped. Claudia  Postherpetic Joseph - Residual pigmentation from prior shingles outbreak on abdomen. - Affected areas remain slightly sore and itchy. - Previous significant discomfort from shingles affecting the leg.  Nocturia - Frequent urination at night with minimal daytime urination. - Increased fluid intake later in the day, believed to contribute to nocturia. - Attempting to drink approximately 64 ounces of water daily.  Weight gain - Gained approximately four pounds since starting visits. - Attributes weight gain to changes in activity and eating habits.     ROS    Objective:      BP 130/66   Pulse 75   Ht 5' 7 (1.702 m)   Wt 180 lb (81.6 kg)   SpO2 96%   BMI 28.19 kg/m    Physical Exam Vitals and nursing note reviewed.  Constitutional:      Appearance: Normal appearance.  HENT:     Head: Normocephalic and atraumatic.  Eyes:     Conjunctiva/sclera: Conjunctivae normal.  Cardiovascular:     Rate and Rhythm: Normal rate and regular rhythm.  Pulmonary:     Effort: Pulmonary effort is normal.     Breath sounds: Normal breath sounds.  Skin:    General: Skin is warm and dry.  Neurological:     Mental Status: She is alert.  Psychiatric:        Mood and Affect: Mood normal.      Results for orders placed or performed in visit on 12/29/23  POCT HgB A1C  Result Value Ref Range   Hemoglobin A1C 5.4 4.0 - 5.6 %   HbA1c POC (<> result, manual entry)     HbA1c, POC (prediabetic range)     HbA1c, POC (controlled diabetic range)        The ASCVD Risk score (Arnett DK, et al., 2019) failed to calculate for the following reasons:   The 2019 ASCVD risk score is only valid for ages 65 to 12    Assessment & Plan:   Problem List Items Addressed This Visit       Cardiovascular and Mediastinum   Hypertension    Primary hypertension Home blood pressure normal. Office reading elevated due to rushing and excitement. -  Rechecked blood pressure at the end of the visit.      Relevant Orders   CMP14+EGFR   TSH     Musculoskeletal and Integument   Baker's cyst of knee, right   Baker's cyst with intermittent lower extremity pain and cramping Recent severe pain episode resolved with corticosteroid injection. Cramping managed with magnesium . Advised on activity modification. - Continue magnesium  supplementation for cramping. - Encouraged daily stretching exercises. - Advised on activity modification to prevent sudden shifts in activity levels.        Other   Nocturia    Nocturia Nocturia likely related to fluid intake patterns. Advised on fluid  intake modification. - Advised to limit fluid intake to four 8-ounce bottles by 6 PM. - Advised to avoid fluid intake after 7:30 PM, except for small sips for brushing teeth or taking vitamins.      Hypertensive retinopathy of both eyes   Recently saw ophthalmology.  BP at goal today.        Relevant Orders   TSH   BMI 28.0-28.9,adult   Encourage more regular activity. She is thinking about starting a stretching class.       Other Visit Diagnoses       Abnormal glucose    -  Primary   Relevant Orders   POCT HgB A1C (Completed)     Low vitamin D  level       Relevant Orders   VITAMIN D  25 Hydroxy (Vit-D Deficiency, Fractures)     Elevated ferritin       Relevant Orders   Iron, TIBC and Ferritin Panel       Assessment and Plan Assessment & Plan   Abnormal glucose A1c well-controlled at 5.4%.  History of shingles Recent shingles episode with residual pigmentation. Advised against vaccination for one year post-episode. - Will consider shingles vaccination one year after the last episode.  General health maintenance Due for tetanus booster. Discussed shingles vaccination timing, bone density, and mammogram screening with emphasis on individualized decision-making. - Obtain tetanus booster at the pharmacy. - Consider shingles vaccination one year after the last episode. - Discussed bone density and mammogram screening options.    Return in about 6 months (around 06/28/2024).    Dorothyann Byars, MD [facility] at [facility]

## 2023-12-29 NOTE — Assessment & Plan Note (Signed)
°  Nocturia Nocturia likely related to fluid intake patterns. Advised on fluid intake modification. - Advised to limit fluid intake to four 8-ounce bottles by 6 PM. - Advised to avoid fluid intake after 7:30 PM, except for small sips for brushing teeth or taking vitamins.

## 2023-12-29 NOTE — Assessment & Plan Note (Signed)
 Encourage more regular activity. She is thinking about starting a stretching class.

## 2023-12-30 ENCOUNTER — Ambulatory Visit: Payer: Self-pay | Admitting: Family Medicine

## 2023-12-30 LAB — CMP14+EGFR
ALT: 16 IU/L (ref 0–32)
AST: 24 IU/L (ref 0–40)
Albumin: 4.3 g/dL (ref 3.8–4.8)
Alkaline Phosphatase: 145 IU/L — ABNORMAL HIGH (ref 49–135)
BUN/Creatinine Ratio: 18 (ref 12–28)
BUN: 17 mg/dL (ref 8–27)
Bilirubin Total: 0.5 mg/dL (ref 0.0–1.2)
CO2: 21 mmol/L (ref 20–29)
Calcium: 9.8 mg/dL (ref 8.7–10.3)
Chloride: 108 mmol/L — ABNORMAL HIGH (ref 96–106)
Creatinine, Ser: 0.95 mg/dL (ref 0.57–1.00)
Globulin, Total: 2.6 g/dL (ref 1.5–4.5)
Glucose: 98 mg/dL (ref 70–99)
Potassium: 3.7 mmol/L (ref 3.5–5.2)
Sodium: 144 mmol/L (ref 134–144)
Total Protein: 6.9 g/dL (ref 6.0–8.5)
eGFR: 61 mL/min/1.73 (ref >59)

## 2023-12-30 LAB — TSH: TSH: 1.77 u[IU]/mL (ref 0.450–4.500)

## 2023-12-30 LAB — VITAMIN D 25 HYDROXY (VIT D DEFICIENCY, FRACTURES): Vit D, 25-Hydroxy: 51.7 ng/mL (ref 30.0–100.0)

## 2023-12-30 LAB — IRON,TIBC AND FERRITIN PANEL
Ferritin: 186 ng/mL — ABNORMAL HIGH (ref 15–150)
Iron Saturation: 31 % (ref 15–55)
Iron: 86 ug/dL (ref 27–139)
Total Iron Binding Capacity: 281 ug/dL (ref 250–450)
UIBC: 195 ug/dL (ref 118–369)

## 2023-12-30 NOTE — Progress Notes (Signed)
 Hi Libby, metabolic panel overall looks good except the alkaline phosphatase is up just a little bit and had bumped up about a year ago and then had come back down and stayed down.  The additional liver enzymes look normal so just continue to work on healthy diet.  Avoid Tylenol and excess alcohol and we will keep an eye on it and plan to recheck again in 3 months.  Your ferritin levels are stable there is slightly elevated, which can indicate some inflammation but your overall iron levels look good.  The thyroid  level looks perfect.  Vitamin D  looks fantastic!

## 2024-02-20 ENCOUNTER — Other Ambulatory Visit: Payer: Self-pay | Admitting: Family Medicine

## 2024-02-20 DIAGNOSIS — I10 Essential (primary) hypertension: Secondary | ICD-10-CM

## 2024-05-31 ENCOUNTER — Encounter: Admitting: Family Medicine
